# Patient Record
Sex: Male | Born: 1985 | Race: White | Hispanic: No | Marital: Single | Smoking: Never smoker
Health system: Southern US, Community
[De-identification: ages and names within clinical notes are randomized; demographics above are authoritative.]

## PROBLEM LIST (undated history)

## (undated) DIAGNOSIS — K219 Gastro-esophageal reflux disease without esophagitis: Secondary | ICD-10-CM

## (undated) DIAGNOSIS — L8990 Pressure ulcer of unspecified site, unspecified stage: Secondary | ICD-10-CM

## (undated) DIAGNOSIS — J9610 Chronic respiratory failure, unspecified whether with hypoxia or hypercapnia: Secondary | ICD-10-CM

## (undated) DIAGNOSIS — F419 Anxiety disorder, unspecified: Secondary | ICD-10-CM

## (undated) DIAGNOSIS — F32A Depression, unspecified: Secondary | ICD-10-CM

## (undated) DIAGNOSIS — F329 Major depressive disorder, single episode, unspecified: Secondary | ICD-10-CM

## (undated) DIAGNOSIS — G8251 Quadriplegia, C1-C4 complete: Secondary | ICD-10-CM

## (undated) DIAGNOSIS — F319 Bipolar disorder, unspecified: Secondary | ICD-10-CM

## (undated) HISTORY — PX: COLOSTOMY: SHX63

---
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
## 2012-10-17 ENCOUNTER — Other Ambulatory Visit: Payer: Self-pay | Admitting: *Deleted

## 2012-10-17 MED ORDER — TEMAZEPAM 7.5 MG PO CAPS: ORAL_CAPSULE | ORAL | Status: DC

## 2012-10-18 ENCOUNTER — Non-Acute Institutional Stay (SKILLED_NURSING_FACILITY): Payer: Medicaid Other | Admitting: Internal Medicine

## 2012-10-18 DIAGNOSIS — D649 Anemia, unspecified: Secondary | ICD-10-CM

## 2012-10-18 DIAGNOSIS — G542 Cervical root disorders, not elsewhere classified: Secondary | ICD-10-CM

## 2012-10-18 DIAGNOSIS — M531 Cervicobrachial syndrome: Secondary | ICD-10-CM

## 2012-10-18 DIAGNOSIS — I82409 Acute embolism and thrombosis of unspecified deep veins of unspecified lower extremity: Secondary | ICD-10-CM

## 2012-10-18 DIAGNOSIS — L89309 Pressure ulcer of unspecified buttock, unspecified stage: Secondary | ICD-10-CM

## 2012-10-18 NOTE — Progress Notes (Signed)
Patient ID: Alan Sanford, male   DOB: 02-28-1986, 27 y.o.   MRN: [id number] Chief complaint; admission for this SNF after transfer from SNF[facility].  History; this is a now 26 year old man who is a C4 quadriplegic since 2011. This was apparently a result of an attempted suicide, he has been functioning is a C4 quadriplegic since then. His original injury was in [place], [place]. He has apparently been in several nursing homes since and has requested to come to this facility closer to his girlfriend. Most of the medical information. I have comes from an admission to [facility]. This was from 05/28/2012 through 07/12/2012. At that point point. He was septic. He required intubation and levo fed to support his blood pressure. He was extubated the following day, but required reintubation. He was found to have a Pseudomonas urinary tract infection and pneumonia per he developed persistent problems with mucus plugging and was switched to vancomycin, meropenem and gentleman. He had a tracheostomy and PEG tube put in at that point. His trach and PEG tube have since been removed. He is eating on his own. He has a colostomy and a chronic indwelling Foley cath.  The patient tells me that he is at wound issues almost time of his initial injury. Most recently he has been followed by a [facility] wound care center in [place]. He was diagnosed with osteomyelitis or one of his current roughly 2 months ago. He is completing a course of vancomycin tomorrow. I have none of the wound care records.  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 #3.Colostomy and chronic indwelling catheter. The colostomy is for protection of wounds on his buttock. #4 chronic decubitus ulcers. He apparently has had plastic surgery for flap closure  in past #5 probable depression at one point, I did not explore this with a later date if he remains in the facility. #6 bipolar affective disorder, probably at one point.  Social; patient is moved here to be closer to his girlfriend was apparently a Engineer, civil (consulting). He met at [facility].   Family history none related by the patient.  Medications; vitamin D2 50,000 units Monday, Wednesday, and Friday, Dulcolax 5 mg, one by mouth daily for constipation, Prozac, 60 mg a day, Claritin, 10 mg a day, magnesium oxide 400 mg once per day, oxybutynin,er, 5 mg, one by mouth daily for bladder spasm, calcium carbonate 1250 mg by mouth twice a day, ferrous sulfate 325 one by mouth twice a day, vitamin C, 501 by mouth twice a day, Geodon 20 mg, one by mouth twice a day, last for bipolar disorder, 30 mg 1 by mouth 3 times a day for muscle spasms, Depakote ER 500 mg 1 by mouth 3 times a day for bipolar, Neurontin 800 mg by mouth 3 times a day, Pepcid 20 mg, one by mouth each bedtime, Seroquel 300, one by mouth each bedtime, Restoril 7.5, one capsule by mouth, every hs  Review of systems #1 respiratory no underlying chronic disorder. #2 sensory level below the nipples  Physical exam;  Respiratory clear entry bilaterally. Cardiac heart sounds are normal. No murmurs. Abdomen; colostomy appears to be functioning well, slightly firm in the right lateral abdomen. I wonder if this is from Lovenox. GU Foley catheter in place. Neurologic as described last C4 sensory level. Skin he has a wound  over the right issue with some undermining inferiorly. However, this looks very stable with no healthy granulation. No evidence of infection. Over his coccyx area of the larger wound mild amount of surface slough.   Impression/plan #1 pressure area over the right ischial area. This appears to be stable and the wound VAC. #2 pressure area coccyx as long as he is as he is improving with current santyl, dressing should continue although  truthfully I think it might do better with silver alginate. #3 traumatic  injury, chronic quadriplegia. #4 history of anemia on iron. This will need to be rechecked. #5. Bipolar disorder on a large number of psychoactive medications, which for the perceivable future. I will not touch, unless it is clear that he becomes a chronic resident here. Psychiatric consultation would then be in order.  #6 he does not have an underlying respiratory disorder that I am aware of, will change his nebulizers. 2. PRn #7 I don't know if he has an issue with thromboembolic disease or not. He is on full dose Lovenox suggesting he has had active thromboembolic disease. I will see if we can verify the history.  The patient is already asking me about home. Discharge on weekends. I referred him to the facility. We'll followup on his pressure ulcers every 2 weeks. #8 Osteomyelitis. I have no information on this. Due to complete Vanc tomorrow. Picc in rt upper arm.   I will maintain the picc line for now. The lovenox use will need to be verified.

## 2012-10-20 ENCOUNTER — Other Ambulatory Visit (HOSPITAL_BASED_OUTPATIENT_CLINIC_OR_DEPARTMENT_OTHER): Payer: Self-pay | Admitting: Internal Medicine

## 2012-10-20 DIAGNOSIS — B999 Unspecified infectious disease: Secondary | ICD-10-CM

## 2012-10-23 ENCOUNTER — Ambulatory Visit (HOSPITAL_COMMUNITY)
Admission: RE | Admit: 2012-10-23 | Discharge: 2012-10-23 | Disposition: A | Discharge status: Home / Self Care | Payer: Federal, State, Local not specified - PPO | Source: Ambulatory Visit | Attending: Internal Medicine | Admitting: Internal Medicine

## 2012-10-23 ENCOUNTER — Other Ambulatory Visit (HOSPITAL_BASED_OUTPATIENT_CLINIC_OR_DEPARTMENT_OTHER): Payer: Self-pay | Admitting: Internal Medicine

## 2012-10-23 DIAGNOSIS — Z452 Encounter for adjustment and management of vascular access device: Secondary | ICD-10-CM | POA: Insufficient documentation

## 2012-10-23 DIAGNOSIS — B999 Unspecified infectious disease: Secondary | ICD-10-CM

## 2012-10-23 MED ORDER — CHLORHEXIDINE GLUCONATE 4 % EX LIQD
CUTANEOUS | Status: AC
  Filled 2012-10-23: qty 30

## 2012-10-25 ENCOUNTER — Non-Acute Institutional Stay (SKILLED_NURSING_FACILITY): Payer: Federal, State, Local not specified - PPO | Admitting: Internal Medicine

## 2012-10-25 DIAGNOSIS — D649 Anemia, unspecified: Secondary | ICD-10-CM | POA: Insufficient documentation

## 2012-10-25 DIAGNOSIS — I2699 Other pulmonary embolism without acute cor pulmonale: Secondary | ICD-10-CM

## 2012-10-25 DIAGNOSIS — G8250 Quadriplegia, unspecified: Secondary | ICD-10-CM

## 2012-10-25 DIAGNOSIS — G4700 Insomnia, unspecified: Secondary | ICD-10-CM

## 2012-10-25 NOTE — Progress Notes (Signed)
Patient ID: Alan Sanford, male   DOB: 1985/08/21, 27 y.o.   MRN: [id number]  Chief complaint-acute visit secondary to anticoagulation issues with history of pulmonary embolism-insomnia.  History of present illness.  Patient is a pleasant 27 year old male with a history of C4 spinal injury and quadriplegia secondary to trauma.  He also has a history of a pulmonary embolism apparently back in October of last year.  He is currently on Lovenox for anticoagulation-we would like to switch this to an oral agent if possible.  Patient also is complaining of insomnia he is on Restoril 7.5 mg and would like that increased.  He does not report any respiratory depression or grogginess in the morning  In regards to anticoagulation-I did have an extensive discussion with his fiance who also was a Engineer, civil (consulting) at [facility] where he was hospitalized-apparently he had been on Xeralto-at some point but developed acute mental status changes and confusion-.  Apparently also had been on Pradaxa   at one point and tolerated this well but was taken of  that back during his most recent hospitalization and put on Lovenox.  Family medical social history has been reviewed per history and physical on 10/18/2012.  Medications have been reviewed.  Review of systems.  Gen. he is denying any fever or chills.  Respiratory does not complain of any shortness of breath.  Cardiac-denies any chest pain.  Muscle skeletal-does not complaining of joint pain at this time.  Neurologic-spinal cord injury as noted in history of present illness.  He is complaining of some insomnia.  Psych-appears to be in good spirits and this has been the case since his arrival here.  Physical exam.  Temperature is 97.9 pulse 70 respirations 18 blood pressure 100/76-116/66  -in general this is a pleasant young male in no distress lying in bed.  His skin is warm and dry.--Do not really note any increased bruising or bleeding  Chest is clear  to auscultation without rhonchi rales or wheezes.  Heart is regular rate and rhythm without murmur gallop or rub.  Abdomen is soft nontender with active bowel sounds.--Colostomy site appears unremarkable  Neurologic-does have quadriplegia x4 with reduced sensation secondary to C4 injury.  Psych-he is alert and oriented x3 pleasant and appropriate  Labs-none noted in the chart for review-updated labs have been ordered. Including a CBC a metabolic panel--  Assessment and plan.  #1-anticoagulation management with history of pulmonary embolism in October 2013-as stated above he apparently did not tolerate Xeralto--- and did well with Pradax which was discontinued during his most recent hospitalization.  -At this point will order a d-dimer for Dr. Jannetta Quint review next week-continue Lovenox for now-- This was discussed with patient's responsible party-- his fiance-- as well.  #2-insomnia-we'll increase Restoril to 15 mg each bedtime-apparently there has been no oversedation or respiratory depression noted this will have to be monitored.  [id number] note greater than 30 minutes spent assessing patient--an extensive discussion with his responsible party via phone -and formulating a plan of care    .

## 2012-10-26 ENCOUNTER — Other Ambulatory Visit: Payer: Self-pay | Admitting: Geriatric Medicine

## 2012-10-26 MED ORDER — TEMAZEPAM 15 MG PO CAPS: ORAL_CAPSULE | ORAL | Status: DC

## 2012-10-26 MED ORDER — TEMAZEPAM 7.5 MG PO CAPS: ORAL_CAPSULE | ORAL | Status: DC

## 2012-11-01 ENCOUNTER — Non-Acute Institutional Stay (SKILLED_NURSING_FACILITY): Payer: Medicaid Other | Admitting: Internal Medicine

## 2012-11-01 DIAGNOSIS — L89309 Pressure ulcer of unspecified buttock, unspecified stage: Secondary | ICD-10-CM

## 2012-11-01 DIAGNOSIS — I82409 Acute embolism and thrombosis of unspecified deep veins of unspecified lower extremity: Secondary | ICD-10-CM

## 2012-11-01 DIAGNOSIS — I2699 Other pulmonary embolism without acute cor pulmonale: Secondary | ICD-10-CM

## 2012-11-01 NOTE — Progress Notes (Signed)
Patient ID: Alan Sanford, male   DOB: 18-Apr-1986, 27 y.o.   MRN: [id number] Chief complaint ; review of anticoagulation, decubitus ulcer.  History; this gentleman has a traumatic quadriplegic since 2011. During the hospitalization in Oct 21, 2013He had a pulmonary embolus. I have none of this information. He came to was on full dose Lovenox. He has a d-dimer within the normal range at 0.27 mcg per ml. Apparently, befor his index hospitalization in December to January 2014, he was on pradaxa, this was apparently changed to Lovenox during this hospitalization. The hospitalization itself was complicated by sepsis, acute respiratory failure requiring intubation. It is not surprising therefore, that he ended up on Lovenox for continued treatment of the pulmonary embolus. He apparently was also on xarelto at one point, but developed acute mental status change. I am not clear about why he was never on Coumadin. Nor I are really clear about the expected duration of treatment.  I reviewed this with his girlfriend and his power of attorney. The original pulmonary embolism presented as a pulmonary mass, [pulmonary infarct]. He really did have mental status changes, which were attributed to the xarelto. Pradaxa was chosen, so that he would not have to have PT and INR checks. If he ever went home. He has no prior history of clotting disorder, no family history of same.  Review of systems; Respiratory no shortness of breath, no cough. Cardiac no chest pain. Skin we continue with the wound VAC to the right initial tuberosity wound. The sacral/wound may need silver alginate, were reviewed next week. Extremities no swelling. Neurologic girlfriend is noted tremors.   Physical exam; Respiratory clear bilaterally. Cardiac heart sounds are normal. No S3 no S4. Skin; his over the right initial tuberosity was very healthy still a 3 mm of depth. Superficial wound over his coccyx area.  Extremities no  edema.  Impression/plan #1 pulmonary embolism/infarct. This was in 10/21/13almost resulted in death. They were apparently told that he would need indefinite anticoagulation. I have discussed this with his POA who is a Engineer, civil (consulting) at [facility]. I've agreed to put him back on pradaxa, even though this is an off label use. I will try to discuss this with his pulmonologist here for in [place]. 2 osteomyelitis; he was followed at a wound care clinic. His vancomycin is completed. White count 3.8, with normal differential, sedimentation rate 11. I will maintain the PICC line for now. #3 bipolar disorder; apparently this has been very severe in the past. I asked his POA about the indications for 2 different antipsychotics,, valproic acid. She informed me that his problems were just severe. I will have the psychiatrist reviewed and in the facility. Right now. I have no plans to change his medication. Fortunately, his mood seems very stable

## 2012-11-02 ENCOUNTER — Other Ambulatory Visit: Payer: Self-pay | Admitting: Geriatric Medicine

## 2012-11-02 MED ORDER — OXYCODONE HCL 10 MG PO TABS: ORAL_TABLET | ORAL | Status: DC

## 2012-11-09 ENCOUNTER — Other Ambulatory Visit: Payer: Self-pay | Admitting: Geriatric Medicine

## 2012-11-09 MED ORDER — DIAZEPAM 10 MG PO TABS: 10.0000 mg | ORAL_TABLET | Freq: Three times a day (TID) | ORAL | Status: DC | PRN

## 2012-11-10 ENCOUNTER — Other Ambulatory Visit: Payer: Self-pay | Admitting: *Deleted

## 2012-11-10 ENCOUNTER — Other Ambulatory Visit (HOSPITAL_BASED_OUTPATIENT_CLINIC_OR_DEPARTMENT_OTHER): Payer: Self-pay | Admitting: Internal Medicine

## 2012-11-10 DIAGNOSIS — S31000A Unspecified open wound of lower back and pelvis without penetration into retroperitoneum, initial encounter: Secondary | ICD-10-CM

## 2012-11-10 MED ORDER — DIAZEPAM 10 MG PO TABS: 10.0000 mg | ORAL_TABLET | Freq: Three times a day (TID) | ORAL | Status: DC | PRN

## 2012-11-13 ENCOUNTER — Ambulatory Visit (HOSPITAL_COMMUNITY)
Admission: RE | Admit: 2012-11-13 | Discharge: 2012-11-13 | Disposition: A | Discharge status: Home / Self Care | Payer: Self-pay | Source: Ambulatory Visit | Attending: Internal Medicine | Admitting: Internal Medicine

## 2012-11-29 ENCOUNTER — Non-Acute Institutional Stay (SKILLED_NURSING_FACILITY): Payer: Federal, State, Local not specified - PPO | Admitting: Internal Medicine

## 2012-11-29 DIAGNOSIS — D649 Anemia, unspecified: Secondary | ICD-10-CM

## 2012-11-29 DIAGNOSIS — F319 Bipolar disorder, unspecified: Secondary | ICD-10-CM | POA: Insufficient documentation

## 2012-11-29 DIAGNOSIS — R609 Edema, unspecified: Secondary | ICD-10-CM | POA: Insufficient documentation

## 2012-11-29 DIAGNOSIS — G8250 Quadriplegia, unspecified: Secondary | ICD-10-CM

## 2012-11-29 DIAGNOSIS — R4182 Altered mental status, unspecified: Secondary | ICD-10-CM

## 2012-11-29 NOTE — Progress Notes (Signed)
Patient ID: Alan Sanford, male   DOB: 1986-06-27, 27 y.o.   MRN: [id number]  This is an acute visit.  Facility [facility].  Level of care skilled.  Chief complaint-acute visit secondary to patient's concerns about Depakote-followup anemia-.  History of present illness.  Patient is a pleasant 27 year old male with a history of C4 quadriplegia since 2011.  Apparently this was the result of an attempted suicide.  He has been a functioning C4 quadriplegic since that.   he does have a listed history of depression bipolar disorder and is on numerous medications including Geodon as well as Depakote 500 mg 3 times a day.  He feels the Depakote occasionally is leading to mental status changes says he talks kind of funny at times.  He states he did not receive the Depakote apparently recently during that  leave of absence and his speech was consistently clearer as well as his thought patterns  He would like to Depakote reduced or discontinued.  In regards to depression and bipolar disorder this has been very stable during his stay here.  Otherwise he denies any shortness of breath chest pain appears to be doing well.  Family medical social history has been reviewed her history and physical on 10/31/2012.  Medications have been reviewed per MAR.  Review of systems.  In general denies fever or chills.  Respiratory denies any shortness of breath or cough.  Cardiac-does not complaining of chest pain.  GI he does have a colostomy bag this has been functioning well denies any abdominal pain nausea or vomiting diarrhea or constipation.  Muscle skeletal does have history of quadriplegia this is stable he does not complaining of pain tonight.  Neurologic-as stated above does not complaining of numbness does continue on Neurontin.  Psych-as stated above he appears to be doing well and is stable.  Physical exam.  Temperature is 90.4 pulse 70 respirations 18 blood pressure 118/74 weight  is stable at 177.  In general this is a pleasant young male in no distress lying in bed he appears comfortable.  The skin is warm and dry apparently does have a history ofischialwound this is followed by Wound Care and Dr. Leanord Hawking  To oropharynx clear mucous membranes moist.  Eyes pupils appear reactive to light extraocular movements intact visual acuity appears grossly intact.  Chest is clear to auscultation without rhonchi rales or wheezes.  Heart is regular rate and rhythm without murmur gallop or rub.--He does have some mild edema bilaterally this appears greater on the left versus the right--pedal pulses somewhat reduced  Abdomen is soft nontender with active bowel sounds colostomy bag appears unremarkable with a moderate amount of stool in it.  GU he does have a Foley catheter draining amber colored urine.  Neurologic again does have deficits secondary to C4 quadriplegia.  His speech is clear-he is alert and oriented x3 pleasant and appropriate.  Psych-as stated above is pleasant and appropriate alert and oriented x3-labs.  11/13/2012.  WBC 9.7 hemoglobin 11.8 platelets 318.  11/07/2012.  WBC 3.8 hemoglobin 11.5 platelets 250.  BUN 10 creatinine 0.36 sodium 140 potassium 4.1.  10/25/2012.  Hemoglobin was 15.3.  Liver function tests were within normal limits.  Valproic acid level was 88.  Assessment and plan.  #1-edema-a bit unilateral on the left-will order a Doppler ultrasound to rule out a DVT--he is on anticoagulation-Pradaxa--with history of pulmonary embolus  #2   altered mental status-history of depression-bipolar disorder-he does continue on Depakote-will order a Depakote level secondary to  his concerns-also liver function tests and ammonia level.  Neurologically he does appear to be at baseline-secondary to stability of psychiatric issues would consider decreasing Depakote level but will discuss with Dr. Boykin Reaper possibility of psych consult  As  well  3-anemia-hemoglobin did drop some according to early May level but has been stable since-orders have been written to guaiac stools x3 so far it appears I see one documented that was negative-will write order to make sure additional samples are obtained-also will update CBC-this appears to be a normocytic anemia-he is on iron--of note he does continue on anticoagulation with history of embolism on Pradaxa  [id number]-of note greater than 30 minutes spent assessing patient-discussing his concerns-and formulating plan of care for numerous diagnoses

## 2012-12-04 ENCOUNTER — Non-Acute Institutional Stay (SKILLED_NURSING_FACILITY): Payer: Federal, State, Local not specified - PPO | Admitting: Internal Medicine

## 2012-12-04 DIAGNOSIS — L89309 Pressure ulcer of unspecified buttock, unspecified stage: Secondary | ICD-10-CM

## 2012-12-08 ENCOUNTER — Other Ambulatory Visit: Payer: Self-pay | Admitting: Geriatric Medicine

## 2012-12-08 MED ORDER — ALPRAZOLAM 0.5 MG PO TABS: ORAL_TABLET | ORAL | Status: DC

## 2012-12-13 NOTE — Progress Notes (Signed)
Patient ID: Alan Sanford, male   DOB: 10/04/1985, 27 y.o.   MRN: [id number]           PROGRESS NOTE  DATE:  12/04/2012    FACILITY: [facility]   LEVEL OF CARE:   SNF   Acute Visit   CHIEF COMPLAINT:  Wound review.  HISTORY OF PRESENT ILLNESS:  Alan Sanford is a gentleman who is a C4 quadriplegic since 2011.  He came to [place] with a wound VAC over a fairly large wound over his right ischial tuberosity.  He had previously been treated for chronic osteomyelitis, completing a course of vancomycin shortly after he arrived here.  He also has a wound over his coccyx.  Per the Wound Care nurse, both of these have been doing very well with a wound VAC on the right ischial tuberosity wound and silver alginate over the coccyx.    Unfortunately, the patient was on his bottom for six hours today, engaged in eating.  He was not able to use his Roho cushion and was not able to adjust his weight.    PHYSICAL EXAMINATION:   SKIN:  INSPECTION:  The area over his right ischium, which  apparently was a vibrant pink color, is now a dusky maroon red.  This was just too much pressure put on this area.   The area over his coccyx has significant hypergranulation and a necrotic rim at roughly 4 o'clock.    ASSESSMENT/PLAN:  Stage IV wound, right ischial tuberosity.  The worry here is that he has put excess pressure on a developing granulation.  I am not going to change the wound VAC at the moment, although I have emphasized the need for pressure off-loading for perhaps the next 48-72 hours.  Wound over the coccyx.  I debrided this with a #10 scalpel.  He is on Pradaxa for DVT prophylaxis.  Hemostasis with direct pressure, although this was difficult.    Careful monitoring of these wounds is going to be necessary, particularly concerning his right ischial tuberosity wound.  It is quite possible that the developing granulation here will slough off.    CPT CODE: 84696

## 2012-12-18 ENCOUNTER — Other Ambulatory Visit: Payer: Self-pay | Admitting: *Deleted

## 2012-12-18 MED ORDER — TEMAZEPAM 7.5 MG PO CAPS: 7.5000 mg | ORAL_CAPSULE | Freq: Every evening | ORAL | Status: DC | PRN

## 2012-12-22 ENCOUNTER — Other Ambulatory Visit: Payer: Self-pay | Admitting: *Deleted

## 2012-12-22 MED ORDER — OXYCODONE HCL 10 MG PO TABS: ORAL_TABLET | ORAL | Status: DC

## 2012-12-26 ENCOUNTER — Non-Acute Institutional Stay (SKILLED_NURSING_FACILITY): Payer: Federal, State, Local not specified - PPO | Admitting: Internal Medicine

## 2012-12-26 DIAGNOSIS — I2699 Other pulmonary embolism without acute cor pulmonale: Secondary | ICD-10-CM

## 2012-12-26 DIAGNOSIS — F319 Bipolar disorder, unspecified: Secondary | ICD-10-CM

## 2012-12-26 DIAGNOSIS — R7989 Other specified abnormal findings of blood chemistry: Secondary | ICD-10-CM

## 2012-12-26 DIAGNOSIS — R509 Fever, unspecified: Secondary | ICD-10-CM

## 2012-12-26 DIAGNOSIS — D649 Anemia, unspecified: Secondary | ICD-10-CM

## 2012-12-26 NOTE — Progress Notes (Signed)
Patient ID: Alan Sanford, male   DOB: [birth year], 27 y.o.   MRN: [id number] This is an acute visit.   Facility [facility].   Level of care skilled.   Chief complaint-acute visit secondary to fever of unknown origin--.   History of present illness.   Patient is a pleasant 27 year old male with a history of C4 quadriplegia since 2011.   Apparently this was the result of an attempted suicide.   He has been a functioning C4 quadriplegic since that. About a week ago he spiked a temperature slightly over 100-lab work was ordered including blood cultures as well as a urine culture and chest x-ray.  The chest x-ray did not show any acute process the urine culture did grow out Klebsiella oxytoca greater than 100,000 colonies he empirically had been started on Cipro this was switched to Rocephin which he continues on.  Blood cultures apparently came back yesterday and showed coagulase-negative staph aureus as well as strep viridans- which is is sensitive to the Rocephin.  It appears he's been afebrile since last week he says he does not feel like he is having fever chills.  He does have a history of osteomyelitis with a history o  fcoccyx  and right tibial wound status this is followed by wound care and apparently these are improving.  He does have a PICC line placed secondary to this asshe was receiving vancomycin   -also  haso an indwelling Foley catheter which is draining amber colored urine.  In regards to other issues these appear to be stable.  He does have a history of significant depression and associate  bipolar disorder.  He is on numerous agents including Prozac Depakote Valium and Seroquel as well as Wellbutrin.  Apparently at one point it was felt the Depakote was leading to mental status changes the Depakote was reduced this has been titrated up somewhat secondary to apparently increased behaviors and this appears to have stabilized.  Patient also has a history of  anemia hemoglobin back in April was over 15 however subsequent  level done in May showed a drop into the 11-12 range and has hovered in that range We did order guaiac testing apparently these have been negative x3.  Today he denies any fever chills appears to be at his baseline.      Family medical social history has been reviewed per history and physical on 10/31/2012.   Medications have been reviewed per MAR.   Review of systems.   In general denies fever or chills.   Respiratory denies any shortness of breath or cough.   Cardiac-does not complaining of chest pain.   GI he does have a colostomy bag this has been functioning well denies any abdominal pain nausea or vomiting diarrhea or constipation.   Muscle skeletal does have history of quadriplegia this is stable he does not complaining of pain .   Neurologic-as stated above does not complain of numbness does continue on Neurontin.   Psych-as stated above he appears to be doing well and is stable.   Physical exam.  Temperature is 98.4 pulse 68 respirations 18 blood pressure 122/64  these are baseline his weight is stable at 177    In general this is a pleasant young male in no distress lying in bed he appears comfortable.   The skin is warm and dry ----y does have a history ofischialwound this is followed by Wound Care and Dr. Theador Hawthorne I did speak with wound care today she says these  wounds are looking significantly improved no sign of infection drainage or odor currently have dressing applied the wound VAC has been discontinued   To oropharynx clear mucous membranes moist.   Eyes pupils appear reactive to light extraocular movements intact visual acuity appears grossly intact.   Chest is clear to auscultation without rhonchi rales or wheezes.   Heart is regular rate and rhythm without murmur gallop or rub.--He does have some mild edema bilaterally--this is relatively baseline with previous  exams d   Abdomen is soft nontender with active bowel sounds colostomy bag appears unremarkable with a moderate amount of stool in it.   GU he does have a Foley catheter draining amber colored urine.   Neurologic again does have deficits secondary to C4 quadriplegia.   His speech is clear-he is alert and oriented x3 pleasant and appropriate.   Psych-as stated above is pleasant and appropriate alert and oriented x3-labs  r  Labs.  12/17/2012.  Urine culture as noted above.  12/18/2012.  Chest x-ray did not show any acute process.  12/18/2012.  Sodium 134 potassium 3.9 BUN 6 creatinine 0.32.  Liver function tests within normal limits except albumin of 2.7 ALT of 73 AST of 82.  CBC 9.6 hemoglobin 11.6 platelets 177.  Marland Kitchen   11/13/2012.   WBC 9.7 hemoglobin 11.8 platelets 318.   11/07/2012.   WBC 3.8 hemoglobin 11.5 platelets 250.   BUN 10 creatinine 0.36 sodium 140 potassium 4.1.   10/25/2012.   Hemoglobin was 15.3.   Liver function tests were within normal limits.   Valproic acid level was 88.   Assessment and plan.  #1-fever of unknown origin-with positive blood culture for strep viridans-he is on Rocephin which is effective against the bacteria in the urine-also against a strep viridans-there is suspicion the positive blood cultures secondary to the PICC line--there have been orders to remove this.  Also will extend the Rocephin for a 14 day total course-this point he appears stable but will have to be monitored closely--also will obtain a CBC with differential --t white count last week was within normal limits.  An echocardiogram also has been ordered.  #2-anemia -- his hemoglobin has stabilized his appears to be a normocytic ---he is on iron-we'll order iron studies as well as a B12 and folate--Will also update CBC.  #3  mildly elevated liver function tests-he is on numerous agents including Depakote-we'll update this to make sure it is not  progressing.  #4 history depression with bipolar disorder-this appears stable on current medications appears to have benefited from the Depakote increase after initial decrease as his mental status and behaviore apparently returned to baseline he was pleasant and appropriate during exam--we'll update Depakote level  #5-history of embolism-he continues on her Pradaxa has tolerated this well  [id number]-of note45 minutes spent assessing patient-discussed his status with nursing staff-reviewing extensive records-and formulating a plan of care--greater than 50% of time spent coordinating and formulating a plan of care

## 2013-01-24 ENCOUNTER — Non-Acute Institutional Stay (SKILLED_NURSING_FACILITY): Payer: Federal, State, Local not specified - PPO | Admitting: Internal Medicine

## 2013-01-24 DIAGNOSIS — I2699 Other pulmonary embolism without acute cor pulmonale: Secondary | ICD-10-CM

## 2013-01-24 DIAGNOSIS — R509 Fever, unspecified: Secondary | ICD-10-CM

## 2013-01-24 DIAGNOSIS — R7989 Other specified abnormal findings of blood chemistry: Secondary | ICD-10-CM

## 2013-01-24 DIAGNOSIS — N390 Urinary tract infection, site not specified: Secondary | ICD-10-CM

## 2013-01-24 DIAGNOSIS — F319 Bipolar disorder, unspecified: Secondary | ICD-10-CM

## 2013-01-24 DIAGNOSIS — D649 Anemia, unspecified: Secondary | ICD-10-CM

## 2013-01-24 DIAGNOSIS — G8250 Quadriplegia, unspecified: Secondary | ICD-10-CM

## 2013-01-24 NOTE — Progress Notes (Signed)
Patient ID: Alan Sanford, male   DOB: 1985-12-06, 27 y.o.   MRN: [id number] This is an acute/routine   visit.  Facility [facility].  Level of care skilled .  Chief complaint-. Medical management of quadriplegia-anemia-neuropathy-history of depression with bipolar disorder-acute visit followup UTI   History of present illness.  Patient is a pleasant 27 year old male with a history of C4 quadriplegia since 2011.  Apparently this was the result of an attempted suicide.  He has been a functioning C4 quadriplegic since that.  About a month ago he spiked a temperature slightly over 100-lab work was ordered including blood cultures as well as a urine culture and chest x-ray.  The chest x-ray did not show any acute process the urine culture did grow out Klebsiella oxytoca greater than 100,000 colonies he empirically had been started on Cipro this was switched to Rocephin which he has completed.  Blood cultures apparently came back  and showed coagulase-negative staph aureus as well as strep viridans- which was is sensitive to the Rocephin.  It appears he's been afebrile--his PICC line was removed and there has been no reoccurrence to my knowledge-he appears to be stable in this regard  .  He does have a history of osteomyelitis with a history o fcoccyx and right tibial wound status this is followed by wound care and apparently these are improving.  -also haso an indwelling Foley catheter which is draining amber colored urine-however recently the staff noted some increased sediment patient was complaining asking for a urine culture which did come back showing Pseudomonas-he has been started onCefepime on day 2 of 10-he does have a listed allergy to this however sensitivities are limited to the strain of Pseudomonas-and he has tolerated Cefepime well in the past with monitoring for any rashes.  In regards to other issues these appear to be stable.  He does have a history of significant depression and  associate bipolar disorder.  He is on numerous agents including Prozac Depakote Valium and Seroquel as well as Wellbutrin.  Apparently at one point it was felt the Depakote was leading to mental status changes the Depakote was reduced this has been titrated up somewhat secondary to apparently increased behaviors and this appears to have stabilized.  Patient also has a history of anemia hemoglobin back in April was over 15 however subsequent level done in May showed a drop into the 11-12 range earlier this month it was back up to 13.8-he is on iron  We did order guaiac testing apparently these have been negative x3.  Today he denies any fever chills appears to be at his baseline.   Family medical social history has been reviewed per history and physical on 10/31/2012.   Medications have been reviewed per MAR .  Review of systems.  In general denies fever or chills.  Respiratory no complaints of shortness of breath or cough.  Cardiac-does not complaining of chest pain.  GI he does have a colostomy bag this has been functioning well denies any abdominal pain nausea or vomiting diarrhea or constipation GU-as stated in history of present illness.  Muscle skeletal does have history of quadriplegia this is stable he does not complain of pain .  Neurologic-as stated above does not complain of numbness does continue on Neurontin.  Psych-as stated above he appears to be doing well and is stable .  Physical exam.  Temperature 97.9 pulse 60 respirations 16 blood pressure 124/78-weight is stable at 177  In general this is a pleasant  young male in no distress lying in bed he appears comfortable.  The skin is warm and dry ---- does have a history ofischialwound this is followed by Wound Care and Dr. Leanord Hawking-  oropharynx clear mucous membranes moist.  Eyes pupils appear reactive to light extraocular movements intact visual acuity appears grossly intact.  Chest is clear to auscultation without rhonchi rales  or wheezes.  Heart is regular rate and rhythm without murmur gallop or rub.--He does have some mild edema bilaterally--this is relatively baseline with previous exams  d  Abdomen is soft nontender with active bowel sounds colostomy bag appears unremarkable with a moderate amount of stool in it.  GU he does have a Foley catheter draining amber colored urine.  Neurologic again does have deficits secondary to C4 quadriplegia.  His speech is clear-he is alert and oriented x3 pleasant and appropriate.  Psych-as stated above is pleasant and appropriate alert and oriented x3-  r  Labs 12/27/2012.  WBC 9.5 hemoglobin 13.8 platelets 270.  Sodium 145 potassium 3.8 BUN 9 creatinine 0.36.  Liver function tests within normal limits.  Valproic acid level XLIX.  [id number].  Folate-15.6- .  12/17/2012.  Urine culture as noted above.  12/18/2012.  Chest x-ray did not show any acute process.  12/18/2012.  Sodium 134 potassium 3.9 BUN 6 creatinine 0.32.  Liver function tests within normal limits except albumin of 2.7 ALT of 73 AST of 82.  CBC 9.6 hemoglobin 11.6 platelets 177.  Marland Kitchen  11/13/2012.  WBC 9.7 hemoglobin 11.8 platelets 318.  11/07/2012.  WBC 3.8 hemoglobin 11.5 platelets 250.  BUN 10 creatinine 0.36 sodium 140 potassium 4.1.  10/25/2012.  Hemoglobin was 15.3.  Liver function tests were within normal limits.  Valproic acid level was 88.   Assessment and plan. #1-UTI-this appears to be stable he is on Cefepime appears to be tolerating this well continue to monitor  #2-fever of unknown origin-with positive blood culture for strep viridans-there's been no reoccurrence he is status post antibiotic-PICC line has been removed suspicions a PICC line was responsible for this #3-history of quadriplegia-patient appears to be doing well and stable in this environment-he is on numerous agents including baclofen ibuprofen as well as Neurontin and oxybutynin .  4-anemia -- his hemoglobin has  stabilized c ---he is on iron-Will also update CBC.  #82mildly elevated liver function tests-this has normalized per recent lab #6 history depression with bipolar disorder-this appears stable on current medications appears to have benefited from the Depakote increase after initial decrease as his mental status and behaviore apparently returned to baseline he was pleasant and appropriate during exam--  #7-history of embolism-he continues on her Pradaxa has tolerated this well #8-neuropathy-he continues on Neurontin apparently with good effect.  #9 as high-risk meds-will check a hemoglobin A1c also a basic metabolic panel  [phone]

## 2013-01-25 DIAGNOSIS — N390 Urinary tract infection, site not specified: Secondary | ICD-10-CM | POA: Insufficient documentation

## 2013-02-11 ENCOUNTER — Non-Acute Institutional Stay (SKILLED_NURSING_FACILITY): Payer: Federal, State, Local not specified - PPO | Admitting: Internal Medicine

## 2013-02-11 DIAGNOSIS — F329 Major depressive disorder, single episode, unspecified: Secondary | ICD-10-CM

## 2013-02-11 DIAGNOSIS — R635 Abnormal weight gain: Secondary | ICD-10-CM

## 2013-02-11 DIAGNOSIS — R609 Edema, unspecified: Secondary | ICD-10-CM

## 2013-02-11 DIAGNOSIS — N390 Urinary tract infection, site not specified: Secondary | ICD-10-CM

## 2013-02-11 NOTE — Progress Notes (Signed)
Patient ID: Alan Sanford, male   DOB: 1986-04-16, 27 y.o.   MRN: [id number] This is an acute visit.  Facility [facility].  Level of care skilled  .  Chief complaint-. Acute visit secondary to edema weight gain   History of present illness.  Patient is a pleasant 27 year old male with a history of C4 quadriplegia since 2011.  Apparently this was the result of an attempted suicide.  He has been a functioning C4 quadriplegic since that He recently has had some weight gain-his fiance who is his responsible party and also happens to be a nurse states that at one point he was on Lasix 40 mg a day-she feels he may need this again.  It appears he's gained about 13 pounds in the last 3 weeks.  He does not complaining of any shortness of breath or chest pain he does not have a listed history of CHF Earlier this month he did get a couple days of Lasix 20 mg a day.  He also has just completed antibiotic for UTI Pseudomonas he was on cefepime   he does not complaining of any fever or chills he does have a history of frequent UTIs.     Marland Kitchen  He does have a history of osteomyelitis with a history o fcoccyx and right tibial wound status this is followed by wound care and apparently these are improving.  Marland Kitchen  He does have a history of significant depression and associate bipolar disorder.  He is on numerous agents including Prozac Depakote Valium and Seroquel he was on Wellbutrin but psychiatrist has dc'd this.  Apparently at one point it was felt the Depakote was leading to mental status changes the Depakote was reduced this has been titrated up somewhat secondary to apparently increased behaviors and this appears to have stabilized.  Patient also has a history of anemia hemoglobin back in April was over 15 however subsequent level done in May showed a drop into the 11-12 range earlier this month it was back up to 13.8-he is on iron  We did order guaiac testing apparently these have been negative x3.  Today  he denies any fever chills appears to be at his baseline.   Family medical social history has been reviewed per history and physical on 10/31/2012 .  Medications have been reviewed per MAR  .  Review of systems.  In general denies fever or chills.  Respiratory no complaints of shortness of breath or cough.  Cardiac-does not complaining of chest pain--he has some edema appears to be relatively mild but somewhat increased from baseline.  GI he does have a colostomy bag this has been functioning well denies any abdominal pain nausea or vomiting diarrhea or constipation  GU-no complaints of dysuria does have a history of UTI.  Muscle skeletal does have history of quadriplegia this is stable he does not complain of pain .  Neurologic-as stated above does not complain of numbness does continue on Neurontin.  Psych-as stated above he appears to be doing well and is stable  .  Physical exam Temperature 96.7 pulse 78 respirations 16 blood pressure 112/62-weight 190 this appears to be a gain of about 13 pounds in the past 3 weeks --O2 95% on room air  In general this is a pleasant young male in no distress lying in bed he appears comfortable.  The skin is warm and dry ---- does have a history of ischial wound this is followed by Wound Care and Dr. Leanord Hawking-  oropharynx  clear mucous membranes moist.  Eyes pupils appear reactive to light extraocular movements intact visual acuity appears grossly intact.  Chest is clear to auscultation without rhonchi rales or wheezes.  Heart is regular rate and rhythm without murmur gallop or rub.--He does have some  edema bilaterally-- moderately increased compared to previous exams  d  Abdomen is soft nontender with active bowel sounds colostomy bag appears unremarkable with a moderate amount of stool in it.  GU he does have a Foley catheter draining amber colored urine.  Neurologic again does have deficits secondary to C4 quadriplegia.  His speech is clear-he is  alert and oriented x3 pleasant and appropriate.  Psych-as stated above is pleasant and appropriate alert and oriented x3-  r  Labs 02/05/2013.  W BC 5.9 hemoglobin 13.6 weight is 172.    12/27/2012.  WBC 9.5 hemoglobin 13.8 platelets 270.  Sodium 145 potassium 3.8 BUN 9 creatinine 0.36.  Liver function tests within normal limits.  Valproic acid level XLIX.  [id number].  Folate-15.6-  .  12/17/2012.  Urine culture as noted above.  12/18/2012.  Chest x-ray did not show any acute process.  12/18/2012.  Sodium 134 potassium 3.9 BUN 6 creatinine 0.32.  Liver function tests within normal limits except albumin of 2.7 ALT of 73 AST of 82.  CBC 9.6 hemoglobin 11.6 platelets 177.  Marland Kitchen  11/13/2012.  WBC 9.7 hemoglobin 11.8 platelets 318.  11/07/2012.  WBC 3.8 hemoglobin 11.5 platelets 250.  BUN 10 creatinine 0.36 sodium 140 potassium 4.1.  10/25/2012.  Hemoglobin was 15.3.  Liver function tests were within normal limits.  Valproic acid level was 88 .  Assessment and plan.   #1- Edema-apparently this is not new-clinically he appears stable as tolerated apparently Lasix in the past will give 40 mg Lasix and 20 mEq of potassium as well we will give the potassium twice a day since he's been on this in the past at this dose.--And potassium level continue be stable per his RP-  Who -I spoke with on the phone  Will check a basic metabolic panel as well as a hemoglobin A1c.  Also weigh 2 times a week and notify provider of any gain greater than 3 pounds   #2-UTI-at this point appears stable he has been afebrile denies any fever or chills although he is quite vulnerable in this regard   #3-history of quadriplegia-patient appears to be doing well and stable in this environment-he is on numerous agents including baclofen ibuprofen as well as Neurontin and oxybutynin  .  #4-history of depression-he is followed by psychiatric service his Wellbutrin was just discontinued we'll continue to  monitor   [phone] --of note 25 minutes spent assessing patient-discussing his status with nursing staff as well as with his responsible party via phone-and formulating a plan of care

## 2013-02-14 ENCOUNTER — Encounter (HOSPITAL_COMMUNITY): Payer: Self-pay | Admitting: *Deleted

## 2013-02-14 ENCOUNTER — Emergency Department (HOSPITAL_COMMUNITY): Payer: Federal, State, Local not specified - PPO

## 2013-02-14 ENCOUNTER — Inpatient Hospital Stay (HOSPITAL_COMMUNITY)
Admission: EM | Admit: 2013-02-14 | Discharge: 2013-02-15 | DRG: 569 | Disposition: A | Discharge status: Skilled Nursing Facility | Payer: Federal, State, Local not specified - PPO | Attending: Internal Medicine | Admitting: Internal Medicine

## 2013-02-14 ENCOUNTER — Other Ambulatory Visit: Payer: Self-pay | Admitting: Geriatric Medicine

## 2013-02-14 DIAGNOSIS — I2699 Other pulmonary embolism without acute cor pulmonale: Secondary | ICD-10-CM | POA: Diagnosis present

## 2013-02-14 DIAGNOSIS — J9610 Chronic respiratory failure, unspecified whether with hypoxia or hypercapnia: Secondary | ICD-10-CM | POA: Diagnosis present

## 2013-02-14 DIAGNOSIS — F319 Bipolar disorder, unspecified: Secondary | ICD-10-CM | POA: Diagnosis present

## 2013-02-14 DIAGNOSIS — L8990 Pressure ulcer of unspecified site, unspecified stage: Secondary | ICD-10-CM | POA: Diagnosis present

## 2013-02-14 DIAGNOSIS — N390 Urinary tract infection, site not specified: Principal | ICD-10-CM | POA: Diagnosis present

## 2013-02-14 DIAGNOSIS — B9689 Other specified bacterial agents as the cause of diseases classified elsewhere: Secondary | ICD-10-CM | POA: Diagnosis present

## 2013-02-14 DIAGNOSIS — G8929 Other chronic pain: Secondary | ICD-10-CM | POA: Diagnosis present

## 2013-02-14 DIAGNOSIS — Z7901 Long term (current) use of anticoagulants: Secondary | ICD-10-CM

## 2013-02-14 DIAGNOSIS — Z79899 Other long term (current) drug therapy: Secondary | ICD-10-CM

## 2013-02-14 DIAGNOSIS — T68XXXA Hypothermia, initial encounter: Secondary | ICD-10-CM

## 2013-02-14 DIAGNOSIS — T68XXXD Hypothermia, subsequent encounter: Secondary | ICD-10-CM

## 2013-02-14 DIAGNOSIS — R680 Hypothermia, not associated with low environmental temperature: Secondary | ICD-10-CM | POA: Diagnosis present

## 2013-02-14 DIAGNOSIS — G8250 Quadriplegia, unspecified: Secondary | ICD-10-CM | POA: Diagnosis present

## 2013-02-14 DIAGNOSIS — M869 Osteomyelitis, unspecified: Secondary | ICD-10-CM | POA: Diagnosis present

## 2013-02-14 DIAGNOSIS — Z933 Colostomy status: Secondary | ICD-10-CM

## 2013-02-14 DIAGNOSIS — L89319 Pressure ulcer of right buttock, unspecified stage: Secondary | ICD-10-CM

## 2013-02-14 DIAGNOSIS — G589 Mononeuropathy, unspecified: Secondary | ICD-10-CM | POA: Diagnosis present

## 2013-02-14 DIAGNOSIS — L89309 Pressure ulcer of unspecified buttock, unspecified stage: Secondary | ICD-10-CM | POA: Diagnosis present

## 2013-02-14 DIAGNOSIS — M542 Cervicalgia: Secondary | ICD-10-CM | POA: Diagnosis present

## 2013-02-14 DIAGNOSIS — R4182 Altered mental status, unspecified: Secondary | ICD-10-CM | POA: Diagnosis present

## 2013-02-14 DIAGNOSIS — Z86711 Personal history of pulmonary embolism: Secondary | ICD-10-CM

## 2013-02-14 DIAGNOSIS — E860 Dehydration: Secondary | ICD-10-CM | POA: Diagnosis present

## 2013-02-14 DIAGNOSIS — L89109 Pressure ulcer of unspecified part of back, unspecified stage: Secondary | ICD-10-CM | POA: Diagnosis present

## 2013-02-14 DIAGNOSIS — K219 Gastro-esophageal reflux disease without esophagitis: Secondary | ICD-10-CM | POA: Diagnosis present

## 2013-02-14 DIAGNOSIS — L8993 Pressure ulcer of unspecified site, stage 3: Secondary | ICD-10-CM | POA: Diagnosis present

## 2013-02-14 DIAGNOSIS — L89899 Pressure ulcer of other site, unspecified stage: Secondary | ICD-10-CM | POA: Diagnosis present

## 2013-02-14 DIAGNOSIS — F411 Generalized anxiety disorder: Secondary | ICD-10-CM | POA: Diagnosis present

## 2013-02-14 HISTORY — DX: Pressure ulcer of unspecified site, unspecified stage: L89.90

## 2013-02-14 HISTORY — DX: Quadriplegia, C1-C4 complete: G82.51

## 2013-02-14 HISTORY — DX: Chronic respiratory failure, unspecified whether with hypoxia or hypercapnia: J96.10

## 2013-02-14 HISTORY — DX: Anxiety disorder, unspecified: F41.9

## 2013-02-14 HISTORY — DX: Gastro-esophageal reflux disease without esophagitis: K21.9

## 2013-02-14 HISTORY — DX: Bipolar disorder, unspecified: F31.9

## 2013-02-14 HISTORY — DX: Depression, unspecified: F32.A

## 2013-02-14 HISTORY — DX: Major depressive disorder, single episode, unspecified: F32.9

## 2013-02-14 LAB — COMPREHENSIVE METABOLIC PANEL
ALT: 99 U/L — ABNORMAL HIGH (ref 0–53)
AST: 112 U/L — ABNORMAL HIGH (ref 0–37)
Albumin: 3.2 g/dL — ABNORMAL LOW (ref 3.5–5.2)
Alkaline Phosphatase: 57 U/L (ref 39–117)
BUN: 18 mg/dL (ref 6–23)
Chloride: 104 mEq/L (ref 96–112)
Potassium: 4.4 mEq/L (ref 3.5–5.1)
Sodium: 141 mEq/L (ref 135–145)
Total Bilirubin: 0.3 mg/dL (ref 0.3–1.2)

## 2013-02-14 LAB — URINE MICROSCOPIC-ADD ON

## 2013-02-14 LAB — URINALYSIS, ROUTINE W REFLEX MICROSCOPIC
Bilirubin Urine: NEGATIVE
Glucose, UA: NEGATIVE mg/dL
Ketones, ur: NEGATIVE mg/dL
Protein, ur: 30 mg/dL — AB

## 2013-02-14 LAB — CBC
MCH: 31.5 pg (ref 26.0–34.0)
MCHC: 33.7 g/dL (ref 30.0–36.0)
MCV: 93.4 fL (ref 78.0–100.0)
Platelets: 177 10*3/uL (ref 150–400)
RDW: 14.7 % (ref 11.5–15.5)

## 2013-02-14 LAB — AMMONIA: Ammonia: 47 umol/L (ref 11–60)

## 2013-02-14 MED ORDER — QUETIAPINE FUMARATE 400 MG PO TABS
400.0000 mg | ORAL_TABLET | Freq: Every day | ORAL | Status: DC
  Administered 2013-02-14: 400 mg via ORAL
  Filled 2013-02-14 (×2): qty 1

## 2013-02-14 MED ORDER — OXYCODONE HCL 10 MG PO TABS: 10.0000 mg | ORAL_TABLET | Freq: Once | ORAL | Status: DC

## 2013-02-14 MED ORDER — DEXTROSE 5 % IV SOLN
1.0000 g | Freq: Once | INTRAVENOUS | Status: AC
  Administered 2013-02-14: 1 g via INTRAVENOUS
  Filled 2013-02-14: qty 10

## 2013-02-14 MED ORDER — ERGOCALCIFEROL 1.25 MG (50000 UT) PO CAPS
50000.0000 [IU] | ORAL_CAPSULE | ORAL | Status: DC
  Administered 2013-02-14: 50000 [IU] via ORAL
  Filled 2013-02-14: qty 1

## 2013-02-14 MED ORDER — DEXTROSE 5 % IV SOLN
1.0000 g | Freq: Three times a day (TID) | INTRAVENOUS | Status: DC
  Filled 2013-02-14 (×2): qty 1

## 2013-02-14 MED ORDER — FUROSEMIDE 40 MG PO TABS
40.0000 mg | ORAL_TABLET | Freq: Every day | ORAL | Status: DC
  Administered 2013-02-15: 40 mg via ORAL
  Filled 2013-02-14: qty 1

## 2013-02-14 MED ORDER — ALBUTEROL SULFATE (5 MG/ML) 0.5% IN NEBU: 2.5000 mg | INHALATION_SOLUTION | RESPIRATORY_TRACT | Status: DC | PRN

## 2013-02-14 MED ORDER — ADULT MULTIVITAMIN W/MINERALS CH
1.0000 | ORAL_TABLET | Freq: Every day | ORAL | Status: DC
  Administered 2013-02-15: 1 via ORAL
  Filled 2013-02-14: qty 1

## 2013-02-14 MED ORDER — BACLOFEN 20 MG PO TABS
30.0000 mg | ORAL_TABLET | Freq: Three times a day (TID) | ORAL | Status: DC
  Administered 2013-02-14 – 2013-02-15 (×3): 30 mg via ORAL
  Filled 2013-02-14 (×5): qty 1

## 2013-02-14 MED ORDER — OXYBUTYNIN CHLORIDE ER 5 MG PO TB24
5.0000 mg | ORAL_TABLET | Freq: Two times a day (BID) | ORAL | Status: DC
  Administered 2013-02-14 – 2013-02-15 (×2): 5 mg via ORAL
  Filled 2013-02-14 (×3): qty 1

## 2013-02-14 MED ORDER — DABIGATRAN ETEXILATE MESYLATE 150 MG PO CAPS
150.0000 mg | ORAL_CAPSULE | Freq: Two times a day (BID) | ORAL | Status: DC
  Administered 2013-02-14 – 2013-02-15 (×2): 150 mg via ORAL
  Filled 2013-02-14 (×3): qty 1

## 2013-02-14 MED ORDER — ONDANSETRON HCL 4 MG/2ML IJ SOLN: 4.0000 mg | Freq: Four times a day (QID) | INTRAMUSCULAR | Status: DC | PRN

## 2013-02-14 MED ORDER — FAMOTIDINE 20 MG PO TABS
20.0000 mg | ORAL_TABLET | Freq: Every day | ORAL | Status: DC
  Administered 2013-02-14: 20 mg via ORAL
  Filled 2013-02-14 (×2): qty 1

## 2013-02-14 MED ORDER — SODIUM CHLORIDE 0.9 % IV SOLN
INTRAVENOUS | Status: AC
  Administered 2013-02-14: 19:00:00 via INTRAVENOUS

## 2013-02-14 MED ORDER — DIVALPROEX SODIUM 250 MG PO DR TAB: 250.0000 mg | DELAYED_RELEASE_TABLET | Freq: Two times a day (BID) | ORAL | Status: DC

## 2013-02-14 MED ORDER — ACETAMINOPHEN 325 MG PO TABS: 650.0000 mg | ORAL_TABLET | Freq: Four times a day (QID) | ORAL | Status: DC | PRN

## 2013-02-14 MED ORDER — DEXTROSE 5 % IV SOLN
2.0000 g | Freq: Two times a day (BID) | INTRAVENOUS | Status: DC
  Administered 2013-02-14 – 2013-02-15 (×2): 2 g via INTRAVENOUS
  Filled 2013-02-14 (×4): qty 2

## 2013-02-14 MED ORDER — LEVOFLOXACIN IN D5W 750 MG/150ML IV SOLN: 750.0000 mg | Freq: Once | INTRAVENOUS | Status: DC

## 2013-02-14 MED ORDER — MAGNESIUM OXIDE 400 MG PO TABS
400.0000 mg | ORAL_TABLET | Freq: Every day | ORAL | Status: DC
  Administered 2013-02-15: 400 mg via ORAL
  Filled 2013-02-14: qty 1

## 2013-02-14 MED ORDER — ACETAMINOPHEN 650 MG RE SUPP: 650.0000 mg | Freq: Four times a day (QID) | RECTAL | Status: DC | PRN

## 2013-02-14 MED ORDER — CEFTRIAXONE SODIUM 1 G IJ SOLR: 1.0000 g | Freq: Once | INTRAMUSCULAR | Status: DC

## 2013-02-14 MED ORDER — FLUOXETINE HCL 40 MG PO CAPS: 40.0000 mg | ORAL_CAPSULE | Freq: Every day | ORAL | Status: DC

## 2013-02-14 MED ORDER — GABAPENTIN 400 MG PO CAPS
800.0000 mg | ORAL_CAPSULE | Freq: Three times a day (TID) | ORAL | Status: DC
  Administered 2013-02-14 – 2013-02-15 (×2): 800 mg via ORAL
  Filled 2013-02-14 (×3): qty 2

## 2013-02-14 MED ORDER — HYDROMORPHONE HCL PF 1 MG/ML IJ SOLN
0.5000 mg | INTRAMUSCULAR | Status: DC | PRN
  Administered 2013-02-14 – 2013-02-15 (×5): 0.5 mg via INTRAVENOUS
  Filled 2013-02-14 (×5): qty 1

## 2013-02-14 MED ORDER — OXYCODONE HCL 5 MG PO TABS
10.0000 mg | ORAL_TABLET | Freq: Once | ORAL | Status: AC
  Administered 2013-02-14: 10 mg via ORAL
  Filled 2013-02-14: qty 2

## 2013-02-14 MED ORDER — GABAPENTIN 800 MG PO TABS
800.0000 mg | ORAL_TABLET | Freq: Three times a day (TID) | ORAL | Status: DC
  Filled 2013-02-14 (×2): qty 1

## 2013-02-14 MED ORDER — HYDROCODONE-ACETAMINOPHEN 5-325 MG PO TABS: 1.0000 | ORAL_TABLET | Freq: Four times a day (QID) | ORAL | Status: DC | PRN

## 2013-02-14 MED ORDER — SODIUM CHLORIDE 0.9 % IV BOLUS (SEPSIS)
1000.0000 mL | INTRAVENOUS | Status: AC
  Administered 2013-02-14: 1000 mL via INTRAVENOUS

## 2013-02-14 MED ORDER — TEMAZEPAM 7.5 MG PO CAPS
7.5000 mg | ORAL_CAPSULE | Freq: Every day | ORAL | Status: DC
  Administered 2013-02-14: 7.5 mg via ORAL
  Filled 2013-02-14: qty 1

## 2013-02-14 MED ORDER — FERROUS SULFATE 325 (65 FE) MG PO TABS
325.0000 mg | ORAL_TABLET | Freq: Every day | ORAL | Status: DC
  Administered 2013-02-15: 325 mg via ORAL
  Filled 2013-02-14 (×2): qty 1

## 2013-02-14 MED ORDER — VITAMIN C 500 MG PO TABS
500.0000 mg | ORAL_TABLET | Freq: Two times a day (BID) | ORAL | Status: DC
Start: 2013-02-14 — End: 2013-02-15
  Administered 2013-02-14 – 2013-02-15 (×2): 500 mg via ORAL
  Filled 2013-02-14 (×3): qty 1

## 2013-02-14 MED ORDER — DIVALPROEX SODIUM 500 MG PO DR TAB
500.0000 mg | DELAYED_RELEASE_TABLET | Freq: Two times a day (BID) | ORAL | Status: DC
  Administered 2013-02-14 – 2013-02-15 (×2): 500 mg via ORAL
  Filled 2013-02-14 (×3): qty 1

## 2013-02-14 MED ORDER — TEMAZEPAM 7.5 MG PO CAPS: 7.5000 mg | ORAL_CAPSULE | Freq: Every evening | ORAL | Status: DC | PRN

## 2013-02-14 MED ORDER — FLUOXETINE HCL 20 MG PO CAPS
60.0000 mg | ORAL_CAPSULE | Freq: Every day | ORAL | Status: DC
  Administered 2013-02-15: 60 mg via ORAL
  Filled 2013-02-14: qty 3

## 2013-02-14 MED ORDER — DIAZEPAM 5 MG PO TABS
10.0000 mg | ORAL_TABLET | Freq: Three times a day (TID) | ORAL | Status: DC | PRN
  Administered 2013-02-15: 10 mg via ORAL
  Filled 2013-02-14: qty 2

## 2013-02-14 MED ORDER — POTASSIUM CHLORIDE CRYS ER 20 MEQ PO TBCR
20.0000 meq | EXTENDED_RELEASE_TABLET | Freq: Two times a day (BID) | ORAL | Status: DC
  Administered 2013-02-14 – 2013-02-15 (×2): 20 meq via ORAL
  Filled 2013-02-14 (×3): qty 1

## 2013-02-14 MED ORDER — LORATADINE 10 MG PO TABS
10.0000 mg | ORAL_TABLET | Freq: Every day | ORAL | Status: DC
  Administered 2013-02-15: 10 mg via ORAL
  Filled 2013-02-14: qty 1

## 2013-02-14 MED ORDER — CALCIUM CARBONATE ANTACID 500 MG PO CHEW
1.0000 | CHEWABLE_TABLET | Freq: Three times a day (TID) | ORAL | Status: DC
  Administered 2013-02-14 – 2013-02-15 (×2): 200 mg via ORAL
  Filled 2013-02-14 (×3): qty 2
  Filled 2013-02-14: qty 1

## 2013-02-14 MED ORDER — ONDANSETRON HCL 4 MG PO TABS: 4.0000 mg | ORAL_TABLET | Freq: Four times a day (QID) | ORAL | Status: DC | PRN

## 2013-02-14 NOTE — H&P (Signed)
[facility] History and Physical  Alan Sanford OZH:[id number] DOB: June 03, 1986 DOA: 02/14/2013  Referring physician: EDP PCP: [name], MD  Specialists:  None  Chief Complaint: Altered Mental Status  HPI: Alan Sanford is a 27 y.o. male , SNF resident, C4 quadriplegia, depression & bipolar disorder, indwelling Foley catheter, recurrent urinary tract infections (Klebsiella & most recently Pseudomonas), bacteremia attributed to PICC line, osteomyelitis, colostomy, anemia and pulmonary embolism on Pradaxa was transferred to the ED due to altered mental status. Staff called EMS due to patient being unresponsive but on EMS arrival patient was responsive, alert and talking normally. However stop stated that he was "not acting right". Patient does not recollect any of these events. Currently he complains of chronic neck pain and feeling thirsty. No reported seizure-like activity. In the ED, patient was initially hypothermic but has since resolved, UA +. Mild transaminitis noted. Other vital signs and labs are unremarkable. Hospitalist admission requested.   Review of Systems: All systems reviewed and apart from history of presenting illness, are negative  Past Medical History  Diagnosis Date  . Quadriplegia, C1-C4 complete   . Bipolar 1 disorder   . Respiratory failure, chronic   . GERD (gastroesophageal reflux disease)   . Pressure ulcer   . Anxiety   . Depression    Past Surgical History  Procedure Laterality Date  . Colostomy     Social History:  reports that he has never smoked. He does not have any smokeless tobacco history on file. He reports that he does not drink alcohol or use illicit drugs. Patient states that he occasionally drinks a beer when he goes home once every 2 weeks.  Allergies  Allergen Reactions  . Cefepime Other (See Comments)    Itching (pt states he can take)  . Sulfa Antibiotics Other (See Comments)    Unknown   . Xarelto [Rivaroxaban] Other  (See Comments)    Unknown     History reviewed. No pertinent family history. family history negative.  Prior to Admission medications   Medication Sig Start Date End Date Taking? Authorizing Provider  baclofen (LIORESAL) 20 MG tablet Take 30 mg by mouth 3 (three) times daily.   Yes Historical Provider, MD  calcium carbonate (TUMS - DOSED IN MG ELEMENTAL CALCIUM) 500 MG chewable tablet Chew 1-2 tablets by mouth 3 (three) times daily. 1000 mg at 0900 and 2100 and 500 mg at 1300   Yes Historical Provider, MD  dabigatran (PRADAXA) 150 MG CAPS capsule Take 150 mg by mouth every 12 (twelve) hours.   Yes Historical Provider, MD  diazepam (VALIUM) 10 MG tablet Take 1 tablet (10 mg total) by mouth every 8 (eight) hours as needed for anxiety. 11/10/12  Yes Tiffany L Reed, DO  divalproex (DEPAKOTE) 250 MG DR tablet Take 250 mg by mouth 2 (two) times daily. 250 mg in addition to 500 mg to equal 750 mg twice daily   Yes Historical Provider, MD  divalproex (DEPAKOTE) 500 MG DR tablet Take 500 mg by mouth 2 (two) times daily. Take 500 mg in addition to 250 mg to equal 750 mg twice daily   Yes Historical Provider, MD  ergocalciferol (VITAMIN D2) 50000 UNITS capsule Take 50,000 Units by mouth every Monday, Wednesday, and Friday.   Yes Historical Provider, MD  famotidine (PEPCID) 20 MG tablet Take 20 mg by mouth at bedtime.   Yes Historical Provider, MD  ferrous sulfate 325 (65 FE) MG tablet Take 325 mg by mouth daily with breakfast.  Yes Historical Provider, MD  FLUoxetine (PROZAC) 20 MG capsule Take 20 mg by mouth daily. 20 mg daily in addition to 40 mg to equal 60 mg daily   Yes Historical Provider, MD  FLUoxetine (PROZAC) 40 MG capsule Take 40 mg by mouth daily. 40 mg in addition to 20 mg to equal 60 mg daily   Yes Historical Provider, MD  furosemide (LASIX) 40 MG tablet Take 40 mg by mouth daily.   Yes Historical Provider, MD  gabapentin (NEURONTIN) 800 MG tablet Take 800 mg by mouth 3 (three) times daily.    Yes Historical Provider, MD  loratadine (CLARITIN) 10 MG tablet Take 10 mg by mouth daily.   Yes Historical Provider, MD  magnesium oxide (MAG-OX) 400 MG tablet Take 400 mg by mouth daily.   Yes Historical Provider, MD  Multiple Vitamin (MULTIVITAMIN WITH MINERALS) TABS tablet Take 1 tablet by mouth daily.   Yes Historical Provider, MD  oxybutynin (DITROPAN-XL) 5 MG 24 hr tablet Take 5 mg by mouth 2 (two) times daily.   Yes Historical Provider, MD  Oxycodone HCl 10 MG TABS Take one tablet by mouth every 4 hours as needed for moderate to sever pain. 12/22/12  Yes Tiffany L Reed, DO  potassium chloride SA (K-DUR,KLOR-CON) 20 MEQ tablet Take 20 mEq by mouth 2 (two) times daily.   Yes Historical Provider, MD  QUEtiapine (SEROQUEL) 400 MG tablet Take 400 mg by mouth at bedtime.   Yes Historical Provider, MD  temazepam (RESTORIL) 7.5 MG capsule Take 7.5 mg by mouth at bedtime.   Yes Historical Provider, MD  vitamin C (ASCORBIC ACID) 500 MG tablet Take 500 mg by mouth 2 (two) times daily.   Yes Historical Provider, MD   Physical Exam: Filed Vitals:   02/14/13 1238 02/14/13 1245 02/14/13 1400 02/14/13 1551  BP:  101/74 102/79 121/85  Pulse:  65 62 75  Temp: 96.4 F (35.8 C)   97.9 F (36.6 C)  TempSrc: Rectal   Oral  Resp:    18  SpO2:  98% 97% 97%     General exam: Moderately built and nourished male patient, lying comfortably supine on the gurney in no obvious distress.  Head, eyes and ENT: Nontraumatic and normocephalic. Pupils equally reacting to light and accommodation. Oral mucosa dry.  Neck: Supple. No JVD, carotid bruit or thyromegaly.  Lymphatics: No lymphadenopathy.  Respiratory system: Slightly reduced breath sounds in the bases but otherwise clear to auscultation. No increased work of breathing.  Cardiovascular system: S1 and S2 heard, RRR. No JVD, murmurs, gallops, clicks or pedal edema.  Gastrointestinal system: Abdomen is nondistended, soft and nontender. Normal bowel  sounds heard. No organomegaly or masses appreciated. Left sided colostomy draining formed green stools.  Central nervous system: Alert and oriented x3. No focal neurological deficits.  Extremities: Contractures of upper extremities & 0/5 power all limbs. Peripheral pulses symmetrically felt.  Skin: 2 pressure ulcers on the sacral and right posterior thigh-dressing clean and dry-will defer to wound care nursing to evaluate.  Musculoskeletal system: Negative exam.  Psychiatry: Pleasant and cooperative.   Labs on Admission:  Basic Metabolic Panel:  Recent Labs Lab 02/14/13 1407  NA 141  K 4.4  CL 104  CO2 25  GLUCOSE 85  BUN 18  CREATININE 0.23*  CALCIUM 9.2   Liver Function Tests:  Recent Labs Lab 02/14/13 1407  AST 112*  ALT 99*  ALKPHOS 57  BILITOT 0.3  PROT 7.3  ALBUMIN 3.2*   No  results found for this basename: LIPASE, AMYLASE,  in the last 168 hours No results found for this basename: AMMONIA,  in the last 168 hours CBC:  Recent Labs Lab 02/14/13 1407  WBC 5.5  HGB 14.7  HCT 43.6  MCV 93.4  PLT 177   Cardiac Enzymes: No results found for this basename: CKTOTAL, CKMB, CKMBINDEX, TROPONINI,  in the last 168 hours  BNP (last 3 results) No results found for this basename: PROBNP,  in the last 8760 hours CBG: No results found for this basename: GLUCAP,  in the last 168 hours  Radiological Exams on Admission: Dg Chest Portable 1 View  02/14/2013   *RADIOLOGY REPORT*  Clinical Data: Altered mental status question fever  PORTABLE CHEST - 1 VIEW  Comparison: Portable exam 1243 hours without priors for comparison.  Findings: Upper normal heart size. Mediastinal contours and pulmonary vascularity normal. Mild subsegmental atelectasis at bases. Lungs otherwise clear. No pleural effusion or pneumothorax. Bones unremarkable.  IMPRESSION: Mild subsegmental atelectasis at lung bases.   Original Report Authenticated By: Ulyses Southward, M.D.    Assessment/Plan Active  Problems:   Quadriplegia   Other pulmonary embolism and infarction   Bipolar disorder, unspecified   UTI (urinary tract infection)   Altered mental status   Dehydration   Hypothermia   Decubitus ulcer of buttock & right posterior thigh   1. Recurrent complicated UTI: Patient states that he's had multiple UTIs in the past. He also indicates that his Foley catheter was changed recently within a week. Patient had prior urine cultures with Pseudomonas and has tolerated cefepime in the past. Followup urine and blood culture results. Continue IV cefepime. 2. Altered mental status: DD-UTI, polypharmacy. Improved/resolved. Monitor closely. Check ammonia levels. No focal deficits. 3. Hypothermia: May be secondary to problem #1. Improved/resolved. Monitor. 4. Dehydration: Brief IV fluids. 5. Mild transaminitis: Patient is an Depakote. Follow LFTs closely. No GI symptoms. 6. 2 pressure ulcers-sacrum & right posterior thigh: Wound care consultation. 7. C4 quadriplegia/colostomy/neuropathy: Stable. 8. Bipolar disorder: Patient on multiple medications at nursing facility. Continue same and monitor closely. 9. History of pulmonary embolism: Continue Pradaxa.    Code Status: Full  Family Communication: None  Disposition Plan: Return to SNF when medically stable.   Time spent: 65 minutes  [facility] Pager [phone]  If 7PM-7AM, please contact night-coverage [email or web address] Password [facility] 02/14/2013, 4:32 PM

## 2013-02-14 NOTE — ED Notes (Signed)
Pt in via EMS, per EMS- they were called out due to patient being unresponsive, upon EMS arrival pt was responsive and alert, talking per normal, per staff patient isn't "acting right" but they were unable to explain what was different. Pt VS within normal limits, pt oriented, no distress noted, pt denies complaints at this time.

## 2013-02-14 NOTE — ED Notes (Signed)
Attempted to call report x1, stated they would call back 

## 2013-02-14 NOTE — Progress Notes (Signed)
Pharmacy Consult - Cefepime  With indwelling foley, UTI Patient states he can take cefepime despite allergy listed as itching Scr stable.  Plan: 1) Cefepime 1 Gram iv Q 8 hours 2) Pharmacy will sign off.  Thank you. Okey Regal, PharmD [phone]

## 2013-02-14 NOTE — Progress Notes (Signed)
ANTIBIOTIC CONSULT NOTE - INITIAL  Pharmacy Consult for Cefepime Indication: Recurrent UTI- Pseudomonas in past  Allergies  Allergen Reactions  . Cefepime Other (See Comments)    Itching (pt states he can take)  . Sulfa Antibiotics Other (See Comments)    Unknown   . Xarelto [Rivaroxaban] Other (See Comments)    Unknown    Patient Measurements:    Vital Signs: Temp: 97.9 F (36.6 C) (08/20 1551) Temp src: Oral (08/20 1551) BP: 121/85 mmHg (08/20 1551) Pulse Rate: 75 (08/20 1551)  Labs:  Recent Labs  02/14/13 1407  WBC 5.5  HGB 14.7  PLT 177  CREATININE 0.23*   CrCl is unknown because there is no height on file for the current visit.  Microbiology: No results found for this or any previous visit (from the past 720 hour(s)).  Medical History: Past Medical History  Diagnosis Date  . Quadriplegia, C1-C4 complete   . Bipolar 1 disorder   . Respiratory failure, chronic   . GERD (gastroesophageal reflux disease)   . Pressure ulcer   . Anxiety   . Depression    Assessment: 27 yr old male with quadriplegia and indwelling foley with history of recurrent UTIs (klebsiella and pseumdonas) admitted 02/14/13 with a positive UA to start Cefepime for UTI. Patient notes itching with Cefepime in the past but states that he can take and has tolerated this medication in the past.   Current SCr is 0.23 (however, likely falsely low with decreased muscle mass from paralysis). WBC is 5.5. LA is 1.7.   Blood and urine cultures are pending.   Goal of Therapy:  Clinical resolution of infection  Plan:  1. Adjust Cefepime 2g IV q12h for severe UTI dosing. 2. Monitor renal function and need to adjust therapy (SCr likely falsely low due to paralysis and decreased muscle mass).    Link Snuffer, PharmD, BCPS Clinical Pharmacist [phone] 02/14/2013,5:46 PM

## 2013-02-14 NOTE — ED Provider Notes (Signed)
Medical screening examination/treatment/procedure(s) were conducted as a shared visit with non-physician practitioner(s) and myself.  I personally evaluated the patient during the encounter  Patient presents with AMS from facility.  Patient with contractures and indwelling foley.  UTI on urinalysis.  Patient given IV abx and foley changed.  Patient will be admitted for IV abx.  Shon Baton, MD 02/14/13 [phone]

## 2013-02-14 NOTE — ED Provider Notes (Signed)
CSN: [id number]     Arrival date & time 02/14/13  1146 History     First MD Initiated Contact with Patient 02/14/13 1204     Chief Complaint  Patient presents with  . Altered Mental Status   (Consider location/radiation/quality/duration/timing/severity/associated sxs/prior Treatment) HPI Pt is a 27yo male with quadriplegia due to C1-C4 injury, BIB ems from nursing home for altered mental status.  Hx of recurrent UTIs.  Staff called EMS due to pt being unresponsive, upon EMS arrival, pt was responsive and alert, talking per normal.  Staff stated pt was "not acting right" but were unable to explain what was different.  Pt was found to have VS WNL, oriented, not distressed. Pt communication is limited.  Speaks in short phrases, poor historian.  Knows he is at [facility] but unsure where he came from.  States his neck hurts, it has hurt for a while, takes norco every 4 hours as needed.  States he has not taking more than normal.  Denies fever, n/v. Denies other complaints at this time.   Past Medical History  Diagnosis Date  . Quadriplegia, C1-C4 complete   . Bipolar 1 disorder   . Respiratory failure, chronic   . GERD (gastroesophageal reflux disease)   . Pressure ulcer   . Anxiety   . Depression    Past Surgical History  Procedure Laterality Date  . Colostomy     History reviewed. No pertinent family history. History  Substance Use Topics  . Smoking status: Not on file  . Smokeless tobacco: Not on file  . Alcohol Use: Not on file    Review of Systems  Unable to perform ROS: Mental status change    Allergies  Cefepime; Sulfa antibiotics; and Xarelto  Home Medications   Current Outpatient Rx  Name  Route  Sig  Dispense  Refill  . baclofen (LIORESAL) 20 MG tablet   Oral   Take 30 mg by mouth 3 (three) times daily.         . calcium carbonate (TUMS - DOSED IN MG ELEMENTAL CALCIUM) 500 MG chewable tablet   Oral   Chew 1-2 tablets by mouth 3 (three) times daily. 1000 mg at  0900 and 2100 and 500 mg at 1300         . dabigatran (PRADAXA) 150 MG CAPS capsule   Oral   Take 150 mg by mouth every 12 (twelve) hours.         . diazepam (VALIUM) 10 MG tablet   Oral   Take 1 tablet (10 mg total) by mouth every 8 (eight) hours as needed for anxiety.   90 tablet   5   . divalproex (DEPAKOTE) 250 MG DR tablet   Oral   Take 250 mg by mouth 2 (two) times daily. 250 mg in addition to 500 mg to equal 750 mg twice daily         . divalproex (DEPAKOTE) 500 MG DR tablet   Oral   Take 500 mg by mouth 2 (two) times daily. Take 500 mg in addition to 250 mg to equal 750 mg twice daily         . ergocalciferol (VITAMIN D2) 50000 UNITS capsule   Oral   Take 50,000 Units by mouth every Monday, Wednesday, and Friday.         . famotidine (PEPCID) 20 MG tablet   Oral   Take 20 mg by mouth at bedtime.         Marland Kitchen  ferrous sulfate 325 (65 FE) MG tablet   Oral   Take 325 mg by mouth daily with breakfast.         . FLUoxetine (PROZAC) 20 MG capsule   Oral   Take 20 mg by mouth daily. 20 mg daily in addition to 40 mg to equal 60 mg daily         . FLUoxetine (PROZAC) 40 MG capsule   Oral   Take 40 mg by mouth daily. 40 mg in addition to 20 mg to equal 60 mg daily         . furosemide (LASIX) 40 MG tablet   Oral   Take 40 mg by mouth daily.         Marland Kitchen gabapentin (NEURONTIN) 800 MG tablet   Oral   Take 800 mg by mouth 3 (three) times daily.         Marland Kitchen loratadine (CLARITIN) 10 MG tablet   Oral   Take 10 mg by mouth daily.         . magnesium oxide (MAG-OX) 400 MG tablet   Oral   Take 400 mg by mouth daily.         . Multiple Vitamin (MULTIVITAMIN WITH MINERALS) TABS tablet   Oral   Take 1 tablet by mouth daily.         Marland Kitchen oxybutynin (DITROPAN-XL) 5 MG 24 hr tablet   Oral   Take 5 mg by mouth 2 (two) times daily.         . Oxycodone HCl 10 MG TABS      Take one tablet by mouth every 4 hours as needed for moderate to sever pain.    180 tablet   0   . potassium chloride SA (K-DUR,KLOR-CON) 20 MEQ tablet   Oral   Take 20 mEq by mouth 2 (two) times daily.         . QUEtiapine (SEROQUEL) 400 MG tablet   Oral   Take 400 mg by mouth at bedtime.         . temazepam (RESTORIL) 7.5 MG capsule   Oral   Take 7.5 mg by mouth at bedtime.         . vitamin C (ASCORBIC ACID) 500 MG tablet   Oral   Take 500 mg by mouth 2 (two) times daily.          BP 121/85  Pulse 75  Temp(Src) 97.9 F (36.6 C) (Oral)  Resp 18  SpO2 97% Physical Exam  Nursing note and vitals reviewed. Constitutional:  Pt lying in exam bed, NAD.  HENT:  Head: Normocephalic and atraumatic.  Eyes: Conjunctivae and EOM are normal. Pupils are equal, round, and reactive to light. Right eye exhibits no discharge. Left eye exhibits no discharge. No scleral icterus.  Neck:  Limited neck rotation. Mild tenderness in left cervical paraspinal muscles and SCM  Cardiovascular: Normal rate, regular rhythm and normal heart sounds.   Pulmonary/Chest: Effort normal and breath sounds normal. No respiratory distress. He has no wheezes. He has no rales. He exhibits no tenderness.  Harsh breath sounds.  Abdominal: Soft. Bowel sounds are normal. He exhibits no distension and no mass. There is no tenderness. There is no rebound and no guarding.  Colostomy bag in place. No surrounding erythema or warmth.   Genitourinary:  Indwelling foley catheter  Musculoskeletal:  Bilateral arm and hand contractures.  Bilateral leg contractures.  Neurological: He is alert.  Skin: Skin is warm and  dry.    ED Course   Procedures (including critical care time)  Labs Reviewed  COMPREHENSIVE METABOLIC PANEL - Abnormal; Notable for the following:    Creatinine, Ser 0.23 (*)    Albumin 3.2 (*)    AST 112 (*)    ALT 99 (*)    All other components within normal limits  URINALYSIS, ROUTINE W REFLEX MICROSCOPIC - Abnormal; Notable for the following:    APPearance CLOUDY (*)     Hgb urine dipstick TRACE (*)    Protein, ur 30 (*)    Nitrite POSITIVE (*)    Leukocytes, UA LARGE (*)    All other components within normal limits  URINE MICROSCOPIC-ADD ON - Abnormal; Notable for the following:    Bacteria, UA MANY (*)    Crystals TRIPLE PHOSPHATE CRYSTALS (*)    All other components within normal limits  URINE CULTURE  CULTURE, BLOOD (ROUTINE X 2)  CULTURE, BLOOD (ROUTINE X 2)  CBC  CG4 I-STAT (LACTIC ACID)   Dg Chest Portable 1 View  02/14/2013   *RADIOLOGY REPORT*  Clinical Data: Altered mental status question fever  PORTABLE CHEST - 1 VIEW  Comparison: Portable exam 1243 hours without priors for comparison.  Findings: Upper normal heart size. Mediastinal contours and pulmonary vascularity normal. Mild subsegmental atelectasis at bases. Lungs otherwise clear. No pleural effusion or pneumothorax. Bones unremarkable.  IMPRESSION: Mild subsegmental atelectasis at lung bases.   Original Report Authenticated By: Ulyses Southward, M.D.   1. UTI (lower urinary tract infection)   2. Altered mental status     MDM  Pt quadrapalegic from C4 injury, BIB ems for altered mental status.  Infection workup.  UA: significant UTI, pt has indwelling foley catheter that will need to be change.  Considered complicated UTI, pt will need admittance for IV antibiotics.  Started on rocephin in ED and given fluid bolus.   Consulting hospitalist: Consulted Dr. Waymon Amato, will get blood cultures, start on cefapime, and repeat rectal temp to determine bed placement.   3:58 PM Tech stated she was unable to obtain rectal temp however did get oral temp: 97.9.  Will admit pt to MedSurg team 10.      Junius Finner, PA-C 02/14/13 1559

## 2013-02-14 NOTE — ED Notes (Signed)
Warms blankets applied to patient

## 2013-02-15 ENCOUNTER — Encounter (HOSPITAL_COMMUNITY): Payer: Self-pay | Admitting: General Practice

## 2013-02-15 DIAGNOSIS — Z5189 Encounter for other specified aftercare: Secondary | ICD-10-CM

## 2013-02-15 LAB — HEPATIC FUNCTION PANEL
ALT: 95 U/L — ABNORMAL HIGH (ref 0–53)
AST: 117 U/L — ABNORMAL HIGH (ref 0–37)
Alkaline Phosphatase: 52 U/L (ref 39–117)
Bilirubin, Direct: <0.1 mg/dL (ref 0.0–0.3)
Total Bilirubin: 0.2 mg/dL — ABNORMAL LOW (ref 0.3–1.2)

## 2013-02-15 LAB — CBC
Hemoglobin: 13.2 g/dL (ref 13.0–17.0)
MCH: 31.7 pg (ref 26.0–34.0)
MCHC: 33.8 g/dL (ref 30.0–36.0)
RDW: 14.7 % (ref 11.5–15.5)

## 2013-02-15 LAB — MRSA PCR SCREENING: MRSA by PCR: POSITIVE — AB

## 2013-02-15 MED ORDER — SODIUM CHLORIDE 0.9 % IV SOLN: INTRAVENOUS | Status: DC

## 2013-02-15 MED ORDER — SODIUM CHLORIDE 0.9 % IV BOLUS (SEPSIS)
500.0000 mL | Freq: Once | INTRAVENOUS | Status: AC
  Administered 2013-02-15: 500 mL via INTRAVENOUS

## 2013-02-15 MED ORDER — DEXTROSE 5 % IV SOLN: 2.0000 g | Freq: Two times a day (BID) | INTRAVENOUS | Status: DC

## 2013-02-15 MED ORDER — CHLORHEXIDINE GLUCONATE CLOTH 2 % EX PADS: 6.0000 | MEDICATED_PAD | Freq: Every day | CUTANEOUS | Status: DC

## 2013-02-15 MED ORDER — MUPIROCIN 2 % EX OINT: 1.0000 "application " | TOPICAL_OINTMENT | Freq: Two times a day (BID) | CUTANEOUS | Status: DC

## 2013-02-15 NOTE — Discharge Summary (Signed)
Physician Discharge Summary  Alan Sanford ZOX:[id number] DOB: 1986-06-20 DOA: 02/14/2013  PCP: [name], MD  Admit date: 02/14/2013 Discharge date: 02/15/2013  Time spent: Greater than 30 minutes  Recommendations for Outpatient Follow-up:  1. Dr. Baltazar Najjar, PCP: Follow up urine and blood culture results that were drawn in the hospital on 02/14/13 and determine choice and duration of antibiotics. 2. Follow up LFT's closely & consider reducing dose of Depakote which may be contributing to abnormal LFT's. 3. Recommend OP Psychiatric consultation. 4. Keep Urology appointment that he apparently has at [facility] next week Tuesday. 5. Continue wound care as prior to hospital admission.  Discharge Diagnoses:  Active Problems:   Quadriplegia   Other pulmonary embolism and infarction   Bipolar disorder, unspecified   UTI (urinary tract infection)   Altered mental status   Dehydration   Hypothermia   Decubitus ulcer of buttock & right posterior thigh   Discharge Condition: Improved & Stable  Diet recommendation: Regular diet.  Filed Weights   02/15/13 0900  Weight: 82.555 kg (182 lb)    History of present illness:  27 y.o. male , SNF resident, C4 quadriplegia, depression & bipolar disorder, indwelling Foley catheter, recurrent urinary tract infections (Klebsiella & most recently Pseudomonas), bacteremia attributed to PICC line, osteomyelitis, colostomy, anemia and pulmonary embolism on Pradaxa was transferred to the ED due to altered mental status. Staff called EMS due to patient being unresponsive but on EMS arrival patient was responsive, alert and talking normally. However staff stated that he was "not acting right". Patient does not recollect any of these events. No reported seizure-like activity. In the ED, patient was initially hypothermic but has since resolved, UA +. Mild transaminitis noted. Other vital signs and labs are unremarkable. Hospitalist admission  requested.  Hospital Course:  1. Recurrent complicated UTI/gram-negative rods (preliminary report): Patient states that he's had multiple UTIs in the past. He also indicates that his Foley catheter was changed recently within a week. Patient had prior urine cultures with Pseudomonas and has tolerated cefepime in the past. Patient was started on IV cefepime empirically. Blood cultures x2: Negative to date and urine cultures are still pending. Patient & HCPOA [name] insist on DC today possibly with a PICC line and IV antibiotics. This M.D. had a very long discussion with both and explained to them that until further culture and sensitivity results are available, decision regarding final antibiotics or duration cannot be determined. Thereby, decision regarding PICC line also cannot be made at this time. They insisted that patient be discharged on current IV cefepime regimen via a peripheral IV line and M.D. at SNF will address choice and duration of antibiotics at the SNF. [name] states that she is an ICU nurse at [facility] and apparently has discussed at length this morning with the M.D. and nursing staff at his SNF and they are agreeable to this plan. Clinical social worker also discussed with the nursing facility and they are willing to accept patient with above treatment plan. As per [name], patient has a urology consultation appointment next Tuesday to address recurrent UTI issue. 2. Altered mental status: DD-UTI, polypharmacy. Improved and seems to have resolved. Ammonia levels normal. No focal deficits. Advised patient and health care power of attorney that his polypharmacy may be contributing to this and recommend outpatient psychiatric consultation to optimize/minimize these medications. They verbalized understanding. 3. Hypothermia: May be secondary to problem #1. Resolved. Per [name], patient also runs soft blood  pressures i.e. SBP in the 80's - 90's. 4. Dehydration:  Resolved after brief IV fluid hydration. 5. Mild transaminitis: Patient is an Depakote. No GI symptoms. Advised patient and [name] that his Depakote dose may have to be reduced which has happened in the past. Will defer this to her PCP at [facility] for further management. Recommend outpatient psychiatric consultation. 6. 2 pressure ulcers-sacrum & right posterior thigh: Patient declined wound care nurse to see him and also declined in overlay mattress. He stated that since he is returning to SNF, he doesn't need to have these done here. Continue wound care as prior to admission. 7. C4 quadriplegia/colostomy/neuropathy: Stable. 8. Bipolar disorder: Patient on multiple medications at nursing facility. Please see above recommendations. 9. History of pulmonary embolism: Continue Pradaxa.   Procedures:  No new procedures.   Consultations:  None  Discharge Exam:  Complaints: Denies complaints & insists on returning to SNF today.  Filed Vitals:   02/14/13 2237 02/15/13 0507 02/15/13 0609 02/15/13 0900  BP: 137/89 78/43 82/52    Pulse: 78 88 88   Temp: 97.5 F (36.4 C) 97.8 F (36.6 C)    TempSrc: Oral     Resp: 20 18    Weight:    82.555 kg (182 lb)  SpO2: 96% 94%      General exam: Moderately built and nourished male patient, lying comfortably in bed in no obvious distress.  Respiratory system: Slightly reduced breath sounds in the bases but otherwise clear to auscultation. No increased work of breathing.  Cardiovascular system: S1 and S2 heard, RRR. No JVD, murmurs, gallops, clicks or pedal edema.  Gastrointestinal system: Abdomen is nondistended, soft and nontender. Normal bowel sounds heard. No organomegaly or masses appreciated. Left sided colostomy draining formed green stools. Foley catheter present. Central nervous system: Alert and oriented x3. No focal neurological deficits.  Extremities: Contractures of upper extremities & 0/5 power all limbs. Peripheral pulses symmetrically  felt.  Skin: Declined exam today.  Psychiatry: Pleasant and cooperative.   Discharge Instructions      Discharge Orders   Future Orders Complete By Expires   Bed rest  As directed    Call MD for:  redness, tenderness, or signs of infection (pain, swelling, redness, odor or green/yellow discharge around incision site)  As directed    Call MD for:  severe uncontrolled pain  As directed    Call MD for:  temperature >100.4  As directed    Call MD for:  As directed    Comments:     Altered Mental Status.   Diet general  As directed    Discharge wound care:  As directed    Comments:     Continue wound care as being done before at nursing facility.       Medication List         baclofen 20 MG tablet  Commonly known as:  LIORESAL  Take 30 mg by mouth 3 (three) times daily.     calcium carbonate 500 MG chewable tablet  Commonly known as:  TUMS - dosed in mg elemental calcium  Chew 1-2 tablets by mouth 3 (three) times daily. 1000 mg at 0900 and 2100 and 500 mg at 1300     dabigatran 150 MG Caps capsule  Commonly known as:  PRADAXA  Take 150 mg by mouth every 12 (twelve) hours.     dextrose 5 % SOLN 50 mL with ceFEPIme 2 G SOLR 2 g  Inject 2 g into the  vein every 12 (twelve) hours. Duration to be determined by MD at Skilled Nursing Facility.     diazepam 10 MG tablet  Commonly known as:  VALIUM  Take 1 tablet (10 mg total) by mouth every 8 (eight) hours as needed for anxiety.     divalproex 500 MG DR tablet  Commonly known as:  DEPAKOTE  Take 500 mg by mouth 2 (two) times daily. Take 500 mg in addition to 250 mg to equal 750 mg twice daily     divalproex 250 MG DR tablet  Commonly known as:  DEPAKOTE  Take 250 mg by mouth 2 (two) times daily. 250 mg in addition to 500 mg to equal 750 mg twice daily     ergocalciferol 50000 UNITS capsule  Commonly known as:  VITAMIN D2  Take 50,000 Units by mouth every Monday, Wednesday, and Friday.     famotidine 20 MG tablet   Commonly known as:  PEPCID  Take 20 mg by mouth at bedtime.     ferrous sulfate 325 (65 FE) MG tablet  Take 325 mg by mouth daily with breakfast.     FLUoxetine 40 MG capsule  Commonly known as:  PROZAC  Take 40 mg by mouth daily. 40 mg in addition to 20 mg to equal 60 mg daily     FLUoxetine 20 MG capsule  Commonly known as:  PROZAC  Take 20 mg by mouth daily. 20 mg daily in addition to 40 mg to equal 60 mg daily     furosemide 40 MG tablet  Commonly known as:  LASIX  Take 40 mg by mouth daily.     gabapentin 800 MG tablet  Commonly known as:  NEURONTIN  Take 800 mg by mouth 3 (three) times daily.     loratadine 10 MG tablet  Commonly known as:  CLARITIN  Take 10 mg by mouth daily.     magnesium oxide 400 MG tablet  Commonly known as:  MAG-OX  Take 400 mg by mouth daily.     multivitamin with minerals Tabs tablet  Take 1 tablet by mouth daily.     oxybutynin 5 MG 24 hr tablet  Commonly known as:  DITROPAN-XL  Take 5 mg by mouth 2 (two) times daily.     Oxycodone HCl 10 MG Tabs  Take one tablet by mouth every 4 hours as needed for moderate to sever pain.     potassium chloride SA 20 MEQ tablet  Commonly known as:  K-DUR,KLOR-CON  Take 20 mEq by mouth 2 (two) times daily.     QUEtiapine 400 MG tablet  Commonly known as:  SEROQUEL  Take 400 mg by mouth at bedtime.     temazepam 7.5 MG capsule  Commonly known as:  RESTORIL  Take 7.5 mg by mouth at bedtime.     vitamin C 500 MG tablet  Commonly known as:  ASCORBIC ACID  Take 500 mg by mouth 2 (two) times daily.       Follow-up Information   Schedule an appointment as soon as possible for a visit with [name], MD. (Follow up Urine & Blood Culture results sent from hospital on 02/14/2013 and determine choice and duration of antibiotics.)    Specialty:  Internal Medicine   Contact information:   [address] [place] [address] [phone]        The results of significant diagnostics  from this hospitalization (including imaging, microbiology, ancillary and laboratory) are listed below for  reference.    Significant Diagnostic Studies: Dg Chest Portable 1 View  02/14/2013   *RADIOLOGY REPORT*  Clinical Data: Altered mental status question fever  PORTABLE CHEST - 1 VIEW  Comparison: Portable exam 1243 hours without priors for comparison.  Findings: Upper normal heart size. Mediastinal contours and pulmonary vascularity normal. Mild subsegmental atelectasis at bases. Lungs otherwise clear. No pleural effusion or pneumothorax. Bones unremarkable.  IMPRESSION: Mild subsegmental atelectasis at lung bases.   Original Report Authenticated By: Ulyses Southward, M.D.    Microbiology: Recent Results (from the past 240 hour(s))  URINE CULTURE     Status: None   Collection Time    02/14/13 12:41 PM      Result Value Range Status   Specimen Description URINE, CATHETERIZED   Final   Special Requests NONE   Final   Culture  Setup Time     Final   Value: 02/14/2013 14:24     Performed at [facility] Count     Final   Value: >=100,000 COLONIES/ML     Performed at [facility]   Culture     Final   Value: GRAM NEGATIVE RODS     Performed at [facility]   Report Status PENDING   Incomplete  CULTURE, BLOOD (ROUTINE X 2)     Status: None   Collection Time    02/14/13  4:50 PM      Result Value Range Status   Specimen Description BLOOD THUMB RIGHT   Final   Special Requests BOTTLES DRAWN AEROBIC ONLY 4.5CC   Final   Culture  Setup Time     Final   Value: 02/14/2013 22:19     Performed at [facility]   Culture     Final   Value:        BLOOD CULTURE RECEIVED NO GROWTH TO DATE CULTURE WILL BE HELD FOR 5 DAYS BEFORE ISSUING A FINAL NEGATIVE REPORT     Performed at [facility]   Report Status PENDING   Incomplete  CULTURE, BLOOD (ROUTINE X 2)     Status: None   Collection Time    02/14/13  4:56 PM      Result Value Range Status    Specimen Description BLOOD THUMB LEFT   Final   Special Requests BOTTLES DRAWN AEROBIC ONLY 1CC   Final   Culture  Setup Time     Final   Value: 02/14/2013 22:19     Performed at [facility]   Culture     Final   Value:        BLOOD CULTURE RECEIVED NO GROWTH TO DATE CULTURE WILL BE HELD FOR 5 DAYS BEFORE ISSUING A FINAL NEGATIVE REPORT     Performed at [facility]   Report Status PENDING   Incomplete  MRSA PCR SCREENING     Status: Abnormal   Collection Time    02/15/13  1:21 AM      Result Value Range Status   MRSA by PCR POSITIVE (*) NEGATIVE Final   Comment:            The GeneXpert MRSA Assay (FDA     approved for NASAL specimens     only), is one component of a     comprehensive MRSA colonization     surveillance program. It is not     intended to diagnose MRSA     infection nor to guide or  monitor treatment for     MRSA infections.     RESULT CALLED TO, READ BACK BY AND VERIFIED WITH:     CALLED TO RN [facility] IRISH [id number] @0653  THANEY     Labs: Basic Metabolic Panel:  Recent Labs Lab 02/14/13 1407  NA 141  K 4.4  CL 104  CO2 25  GLUCOSE 85  BUN 18  CREATININE 0.23*  CALCIUM 9.2   Liver Function Tests:  Recent Labs Lab 02/14/13 1407 02/15/13 0500  AST 112* 117*  ALT 99* 95*  ALKPHOS 57 52  BILITOT 0.3 0.2*  PROT 7.3 6.9  ALBUMIN 3.2* 2.9*   No results found for this basename: LIPASE, AMYLASE,  in the last 168 hours  Recent Labs Lab 02/14/13 1650  AMMONIA 47   CBC:  Recent Labs Lab 02/14/13 1407 02/15/13 0500  WBC 5.5 5.6  HGB 14.7 13.2  HCT 43.6 39.0  MCV 93.4 93.5  PLT 177 179   Cardiac Enzymes: No results found for this basename: CKTOTAL, CKMB, CKMBINDEX, TROPONINI,  in the last 168 hours BNP: BNP (last 3 results) No results found for this basename: PROBNP,  in the last 8760 hours CBG: No results found for this basename: GLUCAP,  in the last 168 hours  Additional  labs:    Signed:  Kailan Carmen  [facility] 02/15/2013, 1:08 PM

## 2013-02-15 NOTE — Clinical Social Work Note (Addendum)
Clinical Social Work Department BRIEF PSYCHOSOCIAL ASSESSMENT 02/15/2013  Patient:  KONGMENG, SANTORO     Account Number:  [id number]     Admit date:  02/14/2013  Clinical Social Worker:  Hulan Fray  Date/Time:  02/15/2013 10:34 AM  Referred by:  CSW  Date Referred:  02/15/2013 Referred for  SNF Placement   Other Referral:   Interview type:  Patient Other interview type:    PSYCHOSOCIAL DATA Living Status:  FACILITY Admitted from facility:  [facility] Level of care:  Skilled Nursing Facility Primary support name:  Abbie Sons Primary support relationship to patient:  PARTNER Degree of support available:   supportive    CURRENT CONCERNS Current Concerns  Post-Acute Placement   Other Concerns:    SOCIAL WORK ASSESSMENT / PLAN Clinical Social Worker was informed that patient was admitted from facility. CSW introduced self and explained reason for visit. Patient reported that he is from [facility] and plans to return at discharge. Patient reported that he is long term resident and will be transported via ambulance. Patient reported that he believes he should be discharged today because he just needs a picc line and the facility can manage him further.    CSW will complete FL2 for MD's signature and will facilitate discharge back to facility when medically stable.   Assessment/plan status:  Psychosocial Support/Ongoing Assessment of Needs Other assessment/ plan:   15:04pm CSW called for EMS pickup for 2:15 and reference #- [id number] Information/referral to community resources:   Patient is from facility.    PATIENT'S/FAMILY'S RESPONSE TO PLAN OF CARE: Patient reported that he is long term resident at [facility] and plans to return at discharge. Patient was appreciative of CSW's visit and assistance.

## 2013-02-15 NOTE — Progress Notes (Signed)
Patient discharged to [facility] via ambulance.

## 2013-02-15 NOTE — Progress Notes (Signed)
Pt's BP this am 82/52. Pulse 88. Patient alert and oriented without complaint.  Paged clinician on call.

## 2013-02-15 NOTE — Consult Note (Addendum)
WOC consult Note Reason for Consult: Consult requested for 2 sacral wounds. These are present on admission. Pt appears to be well-informed regarding wounds and topical treatment and is declining assessment at this time.  He states "the nurses that take care of it at the SNF tell me they are looking good and I am leaving."  He does not want to be turned or have dressings changed today.  He agrees that if he does not discharge, then he will allow wound consult to be performed tomorrow.  He is also refusing the air mattress which has been previously ordered to be applied to his bed.  Explained importance of pressure reduction and he agrees that if he is still in the hospital tomorrow, then air mattress can be placed on the bed at that time. Nursing doc flow sheet records: posterior thigh 5X5cm red stage 3,sacrum is 6X5cm and red; no stage is noted.  Cammie Mcgee MSN, RN, CWOCN, [place], CNS [phone]

## 2013-02-16 ENCOUNTER — Other Ambulatory Visit: Payer: Self-pay | Admitting: Geriatric Medicine

## 2013-02-16 LAB — URINE CULTURE: Colony Count: >=100000

## 2013-02-19 ENCOUNTER — Ambulatory Visit (HOSPITAL_COMMUNITY)
Admission: RE | Admit: 2013-02-19 | Discharge: 2013-02-19 | Disposition: A | Discharge status: Home / Self Care | Payer: Federal, State, Local not specified - PPO | Source: Ambulatory Visit | Attending: Internal Medicine | Admitting: Internal Medicine

## 2013-02-19 ENCOUNTER — Other Ambulatory Visit (HOSPITAL_BASED_OUTPATIENT_CLINIC_OR_DEPARTMENT_OTHER): Payer: Self-pay | Admitting: Internal Medicine

## 2013-02-19 ENCOUNTER — Non-Acute Institutional Stay (SKILLED_NURSING_FACILITY): Payer: Federal, State, Local not specified - PPO | Admitting: Internal Medicine

## 2013-02-19 DIAGNOSIS — N10 Acute tubulo-interstitial nephritis: Secondary | ICD-10-CM

## 2013-02-19 DIAGNOSIS — S31000A Unspecified open wound of lower back and pelvis without penetration into retroperitoneum, initial encounter: Secondary | ICD-10-CM

## 2013-02-19 DIAGNOSIS — G8250 Quadriplegia, unspecified: Secondary | ICD-10-CM | POA: Insufficient documentation

## 2013-02-19 DIAGNOSIS — L8990 Pressure ulcer of unspecified site, unspecified stage: Secondary | ICD-10-CM | POA: Insufficient documentation

## 2013-02-19 DIAGNOSIS — R410 Disorientation, unspecified: Secondary | ICD-10-CM

## 2013-02-19 DIAGNOSIS — R404 Transient alteration of awareness: Secondary | ICD-10-CM

## 2013-02-19 DIAGNOSIS — L89109 Pressure ulcer of unspecified part of back, unspecified stage: Secondary | ICD-10-CM | POA: Insufficient documentation

## 2013-02-19 NOTE — Progress Notes (Signed)
Patient ID: Alan Sanford, male   DOB: [birth year], 27 y.o.   MRN: [id number] Facility; [facility] Chief complaint; review of antibiotics for UTI?Marland Kitchen Temporary altered LOC History; the history is a bit difficult to fall. A urine for C&S ordered on the date and the facility. This showed 3 separate organisms including Morganella, Pseudomonas and vancomycin mycin resistant enterococcus. This would suggest a contaminated specimen. It is not quite certain why it was felt a urine culture was needed in the first place. In any case he has been on cefepime 2 g every 12. He was sent to the ER on August 20 when he was found by one of the nurses to be barely responsive eyes upwards towards the ceiling. He was sent to the emergency room and as far as I can tell they felt he had it UTI as well. Urine culture done in the ER showed probably then see a single organism. His blood cultures were negative and I have reviewed them today on epic. He does not have a history of seizures. Patient states he doesn't remember anything until later in the afternoon around 3 PM. Since this time he appears to be back to his normal self both per the patient and the staff  Past medical history is reviewed; this includes his original injury in 2011. Which was the result of an attempted suicide. 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  Physical examination Respiratory clear entry bilaterally Cardiac heart sounds are normal no murmurs he appears to be euvolemic Abdomen normal ostomy site looks stable no liver no spleen no masses no stigmata.  Neurologic no obvious changes here he is awake alert and oriented  Impression/plan Temporary altered level of consciousness which seems to  have responded over the course of roughly 10 hours' I think this is highly unlikely to have represented any form of urinary tract infection. A seizure comes to mind especially in the setting of multiple psychiatric medications some of which probably are lowering his seizure threshold. If anything like this happens again he will probably need an EEG in [facility] imaging. In the meantime I am going to check a comprehensive lab work including a Depakote level  UTI? I think this is likely Providencia which was cultured in the urine culture in the hospital. This was sensitive to Rocephin, as such it should be sensitive to cefepime which he should continue on for 10 days.   He seems to have multiple sites of intravenous access including a right subclavian line which I gather was new when put in at the hospital as well as to peripheral PICC lines. The logic behind any of this really escapes me. The facility tells me that they cannot do any form of phlebotomy on him which may be part of the

## 2013-02-19 NOTE — Procedures (Signed)
Successful RT IJ TUNNELED SL POWER PICC TIP SVC/RA NO COMP STABLE NO ARM VEINS SEEN TO PLACE PERIPHERAL PICC SO RT IJ USED

## 2013-02-20 LAB — CULTURE, BLOOD (ROUTINE X 2)
Culture: NO GROWTH
Culture: NO GROWTH

## 2013-02-22 ENCOUNTER — Other Ambulatory Visit: Payer: Self-pay | Admitting: *Deleted

## 2013-02-22 MED ORDER — OXYCODONE HCL 10 MG PO TABS: ORAL_TABLET | ORAL | Status: DC

## 2013-02-25 ENCOUNTER — Non-Acute Institutional Stay (SKILLED_NURSING_FACILITY): Payer: Federal, State, Local not specified - PPO | Admitting: Internal Medicine

## 2013-02-25 DIAGNOSIS — D751 Secondary polycythemia: Secondary | ICD-10-CM

## 2013-02-25 NOTE — Progress Notes (Signed)
Patient ID: Alan Sanford, male   DOB: 1985-07-26, 27 y.o.   MRN: [id number] Facility; [facility] Chief complaint, polycythemia His; it was handed lab work this morning a Mr. Alan Sanford who which I think was initially supposed to be a followup of his labs from her recent transient LOC/pyelonephritis. This surprisingly showed a white count of 3.1 a hemoglobin of 21.1 and hematocrit of 16.6%. Lab work from 2 weeks ago showed a white count of 5.9 and hemoglobin of 13.6 furthermore on July 2 his white count was 9.5 and hemoglobin of 13.8. I don't believe that there is a possible cause of this to occur in 2-1/2 weeks nevertheless I felt that necessary to review this.  Review of systems; Respiratory no cough no sputum Cardiac no chest pain GI no nausea vomiting or diarrhea  Physical exam O2 sat 96% on room air pulse 77 respirations 16 with no distress Respiratory shallow air entry but no crackles or wheezes his work of breathing appears to be normal Cardiac heart sounds are normal no murmurs Abdomen no liver no spleen no tenderness Extremities no edema no evidence of a DVT  Impression/plan #1 polycythemia; I suspect this is a lab issue and I'm going to repeat this. Chronic hypoxemia and a man who is a high Alan Sanford injury certainly comes to mind I am doubtful that this could occur in this time frame furthermore his O2 sat seems to be really very reasonable on room air

## 2013-02-27 ENCOUNTER — Other Ambulatory Visit: Payer: Self-pay | Admitting: *Deleted

## 2013-02-27 MED ORDER — TEMAZEPAM 22.5 MG PO CAPS: 22.5000 mg | ORAL_CAPSULE | Freq: Every evening | ORAL | Status: AC | PRN

## 2013-03-07 ENCOUNTER — Non-Acute Institutional Stay (SKILLED_NURSING_FACILITY): Payer: Federal, State, Local not specified - PPO | Admitting: Internal Medicine

## 2013-03-07 DIAGNOSIS — F319 Bipolar disorder, unspecified: Secondary | ICD-10-CM

## 2013-03-07 DIAGNOSIS — G8250 Quadriplegia, unspecified: Secondary | ICD-10-CM

## 2013-03-07 DIAGNOSIS — D649 Anemia, unspecified: Secondary | ICD-10-CM

## 2013-03-07 DIAGNOSIS — Q179 Congenital malformation of ear, unspecified: Secondary | ICD-10-CM

## 2013-03-07 DIAGNOSIS — R7989 Other specified abnormal findings of blood chemistry: Secondary | ICD-10-CM

## 2013-03-07 DIAGNOSIS — R609 Edema, unspecified: Secondary | ICD-10-CM

## 2013-03-07 DIAGNOSIS — I2699 Other pulmonary embolism without acute cor pulmonale: Secondary | ICD-10-CM

## 2013-03-07 NOTE — Progress Notes (Signed)
Patient ID: Alan Sanford, male   DOB: [birth year], 27 y.o.   MRN: [id number] This is an acute/routine visit.  [facility]ility [facility].  Level of care skilled  .  Chief complaint-. Medical management of quadriplegia-anemia-neuropathy-history of depression with bipolar disorder-acute visit followup UTI  History of present illness.  Patient is a pleasant 27 year old male with a history of C4 quadriplegia since 2011.  Apparently this was the result of an attempted suicide.  He has been a functioning C4 quadriplegic since that.   He has had a history of recurrent UTIs but at this point appears to be relatively stable he does have an indwelling Foley catheter    He does have a history of osteomyelitis with a history o fcoccyx and right tibial wound status this is followed by wound care and apparently these are improving.     He does have a history of significant depression and associate bipolar disorder.  He is on numerous agents including Prozac Depakote Valium and Seroquel as well as Wellbutrin.  Apparently at one point it was felt the Depakote was leading to mental status changes the Depakote was reduced this has been titrated up somewhat secondary to apparently increased behaviors and this appears to have stabilized.  Patient also has a history of anemia hemoglobin back in April was over 15 however subsequent level done in May showed a drop into the 11-12 range updated labs show normalization to about 13 however her lab done on August 29 showed a hemoglobin of 21.6 which was rather bizarre Dr. Leanord Hawking did assess this this was thought to be possibly a lab variation in updated lab was ordered I do not see those results yet   he is on iron  We did order guaiac testing apparently these have been negative x3.  Today he denies any fever chills appears to be at his baseline.--He is complaining of some left ear pain he says this has gone on for about a week is not any worse or better than it was a week ago   He says it is the somewhat sudden throbbing pain that lasts for a few seconds and then disappears-it occurs about -4 times a day-he does not complaining of any jaw pain or difficulty swallowing or impaired hearing   he also had some increased edema-a few weeks ago we did start him on Lasix apparently had been on this before-his weight  has stabilized now back to his baseline      Family medical social history has been reviewed per history and physical on 10/31/2012.  Medications have been reviewed per MAR  .  Review of systems.  In general denies fever or chills.  Head ears eyes nose mouth and throat-does not complaining of any visual changes nasal discharge i complaining of some left ear discomfort noted above Respiratory no complaints of shortness of breath or cough.  Cardiac-does not complaining of chest pain.  GI he does have a colostomy bag this has been functioning well denies any abdominal pain nausea or vomiting diarrhea or constipation  GU-as stated in history of present illness.  Muscle skeletal does have history of quadriplegia this is stable he does not complain of pain .  Neurologic-as stated above does not complain of numbness does continue on Neurontin.  Psych-as stated above he appears to be doing well and is stable  .  Physical exam.  Temperature 98.1 pulse 72 respirations 18 blood pressure 116/68 O2 saturation is 96% on room air  l this  is a pleasant young male in no distress lying in bed he appears comfortable.  The skin is warm and dry ---- does have a history ofischialwound this is followed by Wound Care and Dr. Leanord Hawking-  oropharynx clear mucous membranes moist.  Eyes pupils appear reactive to light extraocular movements intact visual acuity appears grossly intac Ears--- -Does have a small amount of wax bilaterally-tympanic membrane is visualized  More so on the left... there is no erythema or exudate noted blaterally  no erythema is noted either .  Chest is clear  to auscultation without rhonchi rales or wheezes.  Heart is regular rate and rhythm without murmur gallop or rub.--He does have some mild edema bilaterally--this is relatively baseline with previous exams  d  Abdomen is soft nontender with active bowel sounds colostomy bag appears unremarkable with a moderate amount of stool in it.  GU he does have a Foley catheter draining amber colored urine.  Neurologic again does have deficits secondary to C4 quadriplegia.  His speech is clear-he is alert and oriented x3 pleasant and appropriate.  Psych-as stated above is pleasant and appropriate alert and oriented x3-  r  Labs   02/23/2013.  UBC 3.1 hemoglobin 21.1 platelets 48.  Sodium 141 potassium 3.9 BUN 9 creatinine 0.3-liver function tests within normal limits except AST of 82 and ALT of 85.  Depakote level was 59  12/27/2012.  WBC 9.5 hemoglobin 13.8 platelets 270.  Sodium 145 potassium 3.8 BUN 9 creatinine 0.36.  Liver function tests within normal limits.  Valproic acid level XLIX.  [id number].  Folate-15.6-  .  12/17/2012.  Urine culture as noted above.  12/18/2012.  Chest x-ray did not show any acute process.  12/18/2012.  Sodium 134 potassium 3.9 BUN 6 creatinine 0.32.  Liver function tests within normal limits except albumin of 2.7 ALT of 73 AST of 82.  CBC 9.6 hemoglobin 11.6 platelets 177.  Marland Kitchen  11/13/2012.  WBC 9.7 hemoglobin 11.8 platelets 318.  11/07/2012.  WBC 3.8 hemoglobin 11.5 platelets 250.  BUN 10 creatinine 0.36 sodium 140 potassium 4.1.  10/25/2012.  Hemoglobin was 15.3.  Liver function tests were within normal limits.  Valproic acid level was    Assessment and plan Ear pain-- physical exam was fairly benign I did not appreciate any adenopathy in the area either continue to monitorf   2-history of quadriplegia-patient appears to be doing well and stable in this environment-he is on numerous agents including baclofen ibuprofen as well as Neurontin and  oxybutynin  . 3-anemia -- somewhat of bizarre recent reading of a hemoglobin of over 21-Dr. Leanord Hawking did order update labs I do not see the results --if not drawn will order followup   #78mildly elevated liver function tests-will updateb  #5 history depression with bipolar disorder-this appears stable on current medications appears to have benefited from the Depakote increase after initial decrease as his mental status and behaviore apparently returned to baseline he was pleasant and appropriate during exam--to note his Prozac was recently increased by psychiatry #6 status edema-this appears to have stabilized he is now on  Lasix with potassium Will update metabolic panel   #8-IONGEXB of embolism-he continues on  Pradaxa has tolerated this well  #8-neuropathy-he continues on Neurontin apparently with good effect.  #9 as high-risk meds-Hbg A1c last month was within normal range at 5.3 [id number]

## 2013-04-05 ENCOUNTER — Other Ambulatory Visit: Payer: Self-pay | Admitting: *Deleted

## 2013-04-05 MED ORDER — OXYCODONE HCL 10 MG PO TABS: ORAL_TABLET | ORAL | Status: DC

## 2013-05-16 ENCOUNTER — Non-Acute Institutional Stay (SKILLED_NURSING_FACILITY): Payer: Federal, State, Local not specified - PPO | Admitting: Internal Medicine

## 2013-05-16 DIAGNOSIS — F319 Bipolar disorder, unspecified: Secondary | ICD-10-CM

## 2013-05-16 DIAGNOSIS — N390 Urinary tract infection, site not specified: Secondary | ICD-10-CM

## 2013-05-30 ENCOUNTER — Non-Acute Institutional Stay (SKILLED_NURSING_FACILITY): Payer: Federal, State, Local not specified - PPO | Admitting: Internal Medicine

## 2013-05-30 DIAGNOSIS — R0902 Hypoxemia: Secondary | ICD-10-CM

## 2013-05-30 NOTE — Progress Notes (Signed)
Patient ID: Alan Sanford, male   DOB: 1986-03-17, 27 y.o.   MRN: [id number]           PROGRESS NOTE  DATE:  05/16/2013  FACILITY: [facility]    LEVEL OF CARE:   SNF   Acute Visit   CHIEF COMPLAINT:  Review of medical issues including recent urine culture.    HISTORY OF PRESENT ILLNESS:  This is a 27 year-old man who is a C4 quadriplegic since 2011.  He has a chronic Foley catheter and colostomy.    He has had a series of questionable UTIs.  The urine culture done earlier this month showed four different organisms including VRE, Pseudomonas, Proteus morganella.  The patient was not symptomatic.  Therefore, no attempt to treat this was initiated.  I believe we did change the Foley catheter.    The patient has bipolar affective disorder and was seen, I believe, by his own outside psychiatrist at [facility] and started on Abilify.    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  We elected to keep him on longstanding Pradaxa even though this is off-label.  (Please see my previous notes on this.)     PHYSICAL EXAMINATION:   GENERAL APPEARANCE:  The patient does not look to be in any distress.   CHEST/RESPIRATORY:  Clear air entry bilaterally.   CARDIOVASCULAR:  CARDIAC:   Heart sounds are normal.  There are no murmurs.   GASTROINTESTINAL:  ABDOMEN:   Somewhat distended.  There are no masses and no tenderness.   CIRCULATION:   EDEMA/VARICOSITIES:  Extremities:  He has some degree of edema in his left thigh greater than the right.  This is not warm.  It is not associated with more distal edema.  I would like to get a better look at this next week when he is undressed.    LABORATORY DATA:  Recent lab work over the last several weeks has been normal including a normal CBC, BMP, and a BNP of 6.6.    ASSESSMENT/PLAN:  Catheter-associated UTI.  I think he did receive a course of cefepime after all of this,  although I do not see these orders.  In general, I would like to avoid treating these catheter-related issues.  Unless there is concrete evidence of infection, we are running the risk of increasingly resistant organisms.    History of a DVT with PE.  He has an allergy to Xarelto and I believe there was some problem with Coumadin.  Therefore, we have been continuing him on Pradaxa.  I have discussed this extensively with his POA.    Bipolar disorder.  Recently added Abilify 5 mg a day through his outside facility psychiatrist.    CPT CODE: 11914

## 2013-05-31 ENCOUNTER — Emergency Department (HOSPITAL_COMMUNITY): Payer: Federal, State, Local not specified - PPO

## 2013-05-31 ENCOUNTER — Encounter (HOSPITAL_COMMUNITY): Payer: Self-pay | Admitting: Emergency Medicine

## 2013-05-31 ENCOUNTER — Inpatient Hospital Stay (HOSPITAL_COMMUNITY)
Admission: EM | Admit: 2013-05-31 | Discharge: 2013-06-28 | DRG: 871 | Disposition: E | Payer: Federal, State, Local not specified - PPO | Attending: Pulmonary Disease | Admitting: Pulmonary Disease

## 2013-05-31 ENCOUNTER — Inpatient Hospital Stay (HOSPITAL_COMMUNITY): Payer: Federal, State, Local not specified - PPO

## 2013-05-31 DIAGNOSIS — T17408A Unspecified foreign body in trachea causing other injury, initial encounter: Secondary | ICD-10-CM | POA: Diagnosis present

## 2013-05-31 DIAGNOSIS — E46 Unspecified protein-calorie malnutrition: Secondary | ICD-10-CM | POA: Diagnosis present

## 2013-05-31 DIAGNOSIS — L8990 Pressure ulcer of unspecified site, unspecified stage: Secondary | ICD-10-CM | POA: Diagnosis present

## 2013-05-31 DIAGNOSIS — R4182 Altered mental status, unspecified: Secondary | ICD-10-CM

## 2013-05-31 DIAGNOSIS — X838XXS Intentional self-harm by other specified means, sequela: Secondary | ICD-10-CM

## 2013-05-31 DIAGNOSIS — E876 Hypokalemia: Secondary | ICD-10-CM | POA: Diagnosis present

## 2013-05-31 DIAGNOSIS — J690 Pneumonitis due to inhalation of food and vomit: Secondary | ICD-10-CM | POA: Diagnosis present

## 2013-05-31 DIAGNOSIS — J15212 Pneumonia due to Methicillin resistant Staphylococcus aureus: Secondary | ICD-10-CM | POA: Diagnosis present

## 2013-05-31 DIAGNOSIS — G8250 Quadriplegia, unspecified: Secondary | ICD-10-CM

## 2013-05-31 DIAGNOSIS — R1310 Dysphagia, unspecified: Secondary | ICD-10-CM | POA: Diagnosis present

## 2013-05-31 DIAGNOSIS — A419 Sepsis, unspecified organism: Secondary | ICD-10-CM

## 2013-05-31 DIAGNOSIS — F319 Bipolar disorder, unspecified: Secondary | ICD-10-CM

## 2013-05-31 DIAGNOSIS — T17308A Unspecified foreign body in larynx causing other injury, initial encounter: Secondary | ICD-10-CM | POA: Diagnosis present

## 2013-05-31 DIAGNOSIS — J9620 Acute and chronic respiratory failure, unspecified whether with hypoxia or hypercapnia: Secondary | ICD-10-CM

## 2013-05-31 DIAGNOSIS — J9819 Other pulmonary collapse: Secondary | ICD-10-CM

## 2013-05-31 DIAGNOSIS — IMO0002 Reserved for concepts with insufficient information to code with codable children: Secondary | ICD-10-CM | POA: Diagnosis present

## 2013-05-31 DIAGNOSIS — J988 Other specified respiratory disorders: Secondary | ICD-10-CM

## 2013-05-31 DIAGNOSIS — R630 Anorexia: Secondary | ICD-10-CM | POA: Diagnosis present

## 2013-05-31 DIAGNOSIS — G9340 Encephalopathy, unspecified: Secondary | ICD-10-CM | POA: Diagnosis present

## 2013-05-31 DIAGNOSIS — D649 Anemia, unspecified: Secondary | ICD-10-CM

## 2013-05-31 DIAGNOSIS — E669 Obesity, unspecified: Secondary | ICD-10-CM | POA: Diagnosis present

## 2013-05-31 DIAGNOSIS — E872 Acidosis, unspecified: Secondary | ICD-10-CM | POA: Diagnosis present

## 2013-05-31 DIAGNOSIS — L89309 Pressure ulcer of unspecified buttock, unspecified stage: Secondary | ICD-10-CM | POA: Diagnosis present

## 2013-05-31 DIAGNOSIS — Z933 Colostomy status: Secondary | ICD-10-CM

## 2013-05-31 DIAGNOSIS — E870 Hyperosmolality and hypernatremia: Secondary | ICD-10-CM | POA: Diagnosis not present

## 2013-05-31 DIAGNOSIS — J9600 Acute respiratory failure, unspecified whether with hypoxia or hypercapnia: Secondary | ICD-10-CM

## 2013-05-31 DIAGNOSIS — Z7901 Long term (current) use of anticoagulants: Secondary | ICD-10-CM

## 2013-05-31 DIAGNOSIS — I9589 Other hypotension: Secondary | ICD-10-CM | POA: Diagnosis present

## 2013-05-31 DIAGNOSIS — N390 Urinary tract infection, site not specified: Secondary | ICD-10-CM | POA: Diagnosis present

## 2013-05-31 DIAGNOSIS — Z8701 Personal history of pneumonia (recurrent): Secondary | ICD-10-CM

## 2013-05-31 DIAGNOSIS — Z66 Do not resuscitate: Secondary | ICD-10-CM | POA: Diagnosis present

## 2013-05-31 DIAGNOSIS — R627 Adult failure to thrive: Secondary | ICD-10-CM | POA: Diagnosis present

## 2013-05-31 DIAGNOSIS — R609 Edema, unspecified: Secondary | ICD-10-CM

## 2013-05-31 DIAGNOSIS — I2699 Other pulmonary embolism without acute cor pulmonale: Secondary | ICD-10-CM

## 2013-05-31 DIAGNOSIS — R0902 Hypoxemia: Secondary | ICD-10-CM

## 2013-05-31 DIAGNOSIS — Z515 Encounter for palliative care: Secondary | ICD-10-CM

## 2013-05-31 LAB — URINALYSIS, ROUTINE W REFLEX MICROSCOPIC
Ketones, ur: 15 mg/dL — AB
Nitrite: POSITIVE — AB
Specific Gravity, Urine: 1.028 (ref 1.005–1.030)
pH: 7 (ref 5.0–8.0)

## 2013-05-31 LAB — POCT I-STAT 3, ART BLOOD GAS (G3+)
TCO2: 28 mmol/L (ref 0–100)
pH, Arterial: 7.31 — ABNORMAL LOW (ref 7.350–7.450)

## 2013-05-31 LAB — CBC WITH DIFFERENTIAL/PLATELET
Eosinophils Absolute: 0.1 10*3/uL (ref 0.0–0.7)
Eosinophils Relative: 0 % (ref 0–5)
Hemoglobin: 14.5 g/dL (ref 13.0–17.0)
Lymphs Abs: 3.1 10*3/uL (ref 0.7–4.0)
MCH: 33 pg (ref 26.0–34.0)
MCV: 99.3 fL (ref 78.0–100.0)
Monocytes Relative: 15 % — ABNORMAL HIGH (ref 3–12)
RBC: 4.39 MIL/uL (ref 4.22–5.81)

## 2013-05-31 LAB — COMPREHENSIVE METABOLIC PANEL
BUN: 12 mg/dL (ref 6–23)
Calcium: 9.1 mg/dL (ref 8.4–10.5)
Creatinine, Ser: 0.29 mg/dL — ABNORMAL LOW (ref 0.50–1.35)
GFR calc Af Amer: >90 mL/min (ref >90)
Glucose, Bld: 98 mg/dL (ref 70–99)
Total Protein: 7.7 g/dL (ref 6.0–8.3)

## 2013-05-31 LAB — PRO B NATRIURETIC PEPTIDE: Pro B Natriuretic peptide (BNP): 68.1 pg/mL (ref 0–125)

## 2013-05-31 LAB — GLUCOSE, CAPILLARY

## 2013-05-31 LAB — PROTIME-INR: INR: 1.1 (ref 0.00–1.49)

## 2013-05-31 LAB — URINE MICROSCOPIC-ADD ON

## 2013-05-31 LAB — CG4 I-STAT (LACTIC ACID): Lactic Acid, Venous: 1.55 mmol/L (ref 0.5–2.2)

## 2013-05-31 MED ORDER — VANCOMYCIN HCL IN DEXTROSE 1-5 GM/200ML-% IV SOLN
1000.0000 mg | Freq: Once | INTRAVENOUS | Status: DC
  Filled 2013-05-31: qty 200

## 2013-05-31 MED ORDER — PROPOFOL 10 MG/ML IV EMUL
5.0000 ug/kg/min | INTRAVENOUS | Status: DC
  Administered 2013-05-31: 35 ug/kg/min via INTRAVENOUS
  Administered 2013-06-01: 20 ug/kg/min via INTRAVENOUS
  Filled 2013-05-31 (×3): qty 100

## 2013-05-31 MED ORDER — ROCURONIUM BROMIDE 50 MG/5ML IV SOLN
INTRAVENOUS | Status: DC | PRN
  Administered 2013-05-31: 50 mg via INTRAVENOUS

## 2013-05-31 MED ORDER — DEXTROSE 5 % IV SOLN
1.0000 g | Freq: Three times a day (TID) | INTRAVENOUS | Status: DC
  Administered 2013-05-31 – 2013-06-05 (×17): 1 g via INTRAVENOUS
  Filled 2013-05-31 (×21): qty 1

## 2013-05-31 MED ORDER — DEXTROSE 5 % IV SOLN
2.0000 g | Freq: Once | INTRAVENOUS | Status: AC
  Administered 2013-05-31: 2 g via INTRAVENOUS
  Filled 2013-05-31: qty 2

## 2013-05-31 MED ORDER — DIVALPROEX SODIUM 500 MG PO DR TAB: 500.0000 mg | DELAYED_RELEASE_TABLET | Freq: Two times a day (BID) | ORAL | Status: DC

## 2013-05-31 MED ORDER — PROPOFOL 10 MG/ML IV EMUL
5.0000 ug/kg/min | INTRAVENOUS | Status: DC
  Administered 2013-05-31: 20 ug/kg/min via INTRAVENOUS

## 2013-05-31 MED ORDER — FENTANYL CITRATE 0.05 MG/ML IJ SOLN: 25.0000 ug | INTRAMUSCULAR | Status: DC | PRN

## 2013-05-31 MED ORDER — SODIUM CHLORIDE 0.9 % IV SOLN
1000.0000 mL | Freq: Once | INTRAVENOUS | Status: AC
  Administered 2013-05-31: 1000 mL via INTRAVENOUS

## 2013-05-31 MED ORDER — ETOMIDATE 2 MG/ML IV SOLN
INTRAVENOUS | Status: DC | PRN
  Administered 2013-05-31: 20 mg via INTRAVENOUS

## 2013-05-31 MED ORDER — MIDAZOLAM HCL 2 MG/2ML IJ SOLN
2.0000 mg | Freq: Once | INTRAMUSCULAR | Status: AC
  Administered 2013-05-31: 2 mg via INTRAVENOUS

## 2013-05-31 MED ORDER — MIDAZOLAM HCL 2 MG/2ML IJ SOLN
INTRAMUSCULAR | Status: AC
  Filled 2013-05-31: qty 2

## 2013-05-31 MED ORDER — IOHEXOL 350 MG/ML SOLN
100.0000 mL | Freq: Once | INTRAVENOUS | Status: AC | PRN
  Administered 2013-05-31: 100 mL via INTRAVENOUS

## 2013-05-31 MED ORDER — ENOXAPARIN SODIUM 40 MG/0.4ML ~~LOC~~ SOLN
40.0000 mg | SUBCUTANEOUS | Status: DC
  Administered 2013-05-31: 40 mg via SUBCUTANEOUS
  Filled 2013-05-31 (×3): qty 0.4

## 2013-05-31 MED ORDER — ROCURONIUM BROMIDE 50 MG/5ML IV SOLN
50.0000 mg | Freq: Once | INTRAVENOUS | Status: DC
  Filled 2013-05-31: qty 5

## 2013-05-31 MED ORDER — PROPOFOL 10 MG/ML IV EMUL
INTRAVENOUS | Status: AC
  Filled 2013-05-31: qty 100

## 2013-05-31 MED ORDER — QUETIAPINE FUMARATE 400 MG PO TABS
400.0000 mg | ORAL_TABLET | Freq: Every day | ORAL | Status: DC
  Administered 2013-06-01 – 2013-06-13 (×13): 400 mg via ORAL
  Filled 2013-05-31 (×15): qty 1

## 2013-05-31 MED ORDER — DIVALPROEX SODIUM 250 MG PO DR TAB: 250.0000 mg | DELAYED_RELEASE_TABLET | Freq: Two times a day (BID) | ORAL | Status: DC

## 2013-05-31 MED ORDER — LINEZOLID 2 MG/ML IV SOLN
600.0000 mg | INTRAVENOUS | Status: AC
  Administered 2013-05-31: 600 mg via INTRAVENOUS
  Filled 2013-05-31: qty 300

## 2013-05-31 MED ORDER — SODIUM CHLORIDE 0.9 % IV SOLN
1000.0000 mL | INTRAVENOUS | Status: DC
  Administered 2013-05-31: 1000 mL via INTRAVENOUS

## 2013-05-31 MED ORDER — FENTANYL CITRATE 0.05 MG/ML IJ SOLN
50.0000 ug | Freq: Once | INTRAMUSCULAR | Status: AC
  Administered 2013-05-31: 50 ug via INTRAVENOUS

## 2013-05-31 MED ORDER — LINEZOLID 2 MG/ML IV SOLN
600.0000 mg | Freq: Two times a day (BID) | INTRAVENOUS | Status: DC
  Administered 2013-05-31 – 2013-06-03 (×6): 600 mg via INTRAVENOUS
  Filled 2013-05-31 (×7): qty 300

## 2013-05-31 MED ORDER — ETOMIDATE 2 MG/ML IV SOLN: 20.0000 mg | Freq: Once | INTRAVENOUS | Status: DC

## 2013-05-31 MED ORDER — MIDAZOLAM HCL 2 MG/2ML IJ SOLN: 2.0000 mg | INTRAMUSCULAR | Status: DC | PRN

## 2013-05-31 MED ORDER — ARIPIPRAZOLE 10 MG PO TABS
10.0000 mg | ORAL_TABLET | Freq: Every day | ORAL | Status: DC
  Administered 2013-05-31 – 2013-06-14 (×15): 10 mg via ORAL
  Filled 2013-05-31 (×16): qty 1

## 2013-05-31 MED ORDER — VALPROIC ACID 250 MG/5ML PO SYRP
750.0000 mg | ORAL_SOLUTION | Freq: Two times a day (BID) | ORAL | Status: DC
  Administered 2013-05-31 – 2013-06-04 (×8): 750 mg
  Filled 2013-05-31 (×9): qty 15

## 2013-05-31 MED ORDER — BENZTROPINE MESYLATE 0.5 MG PO TABS
0.5000 mg | ORAL_TABLET | Freq: Two times a day (BID) | ORAL | Status: DC
  Administered 2013-05-31 – 2013-06-14 (×29): 0.5 mg via ORAL
  Filled 2013-05-31 (×32): qty 1

## 2013-05-31 MED ORDER — KCL IN DEXTROSE-NACL 20-5-0.45 MEQ/L-%-% IV SOLN
INTRAVENOUS | Status: DC
  Administered 2013-05-31 – 2013-06-01 (×2): via INTRAVENOUS
  Filled 2013-05-31 (×3): qty 1000

## 2013-05-31 MED ORDER — FENTANYL CITRATE 0.05 MG/ML IJ SOLN
INTRAMUSCULAR | Status: AC
  Filled 2013-05-31: qty 2

## 2013-05-31 MED ORDER — FLUOXETINE HCL 20 MG PO CAPS
40.0000 mg | ORAL_CAPSULE | Freq: Every day | ORAL | Status: DC
  Administered 2013-05-31: 40 mg via ORAL
  Filled 2013-05-31 (×3): qty 2

## 2013-05-31 MED ORDER — SODIUM CHLORIDE 0.9 % IV SOLN
20.0000 ug/h | INTRAVENOUS | Status: DC
  Administered 2013-05-31: 50 ug/h via INTRAVENOUS
  Filled 2013-05-31: qty 50

## 2013-05-31 MED ORDER — PANTOPRAZOLE SODIUM 40 MG IV SOLR
40.0000 mg | INTRAVENOUS | Status: DC
  Administered 2013-05-31: 40 mg via INTRAVENOUS
  Filled 2013-05-31: qty 40

## 2013-05-31 MED ORDER — CLONAZEPAM 1 MG PO TABS
2.0000 mg | ORAL_TABLET | Freq: Two times a day (BID) | ORAL | Status: DC
  Administered 2013-05-31 – 2013-06-14 (×28): 2 mg via ORAL
  Filled 2013-05-31 (×12): qty 4
  Filled 2013-05-31 (×2): qty 2
  Filled 2013-05-31 (×3): qty 4
  Filled 2013-05-31: qty 2
  Filled 2013-05-31: qty 4
  Filled 2013-05-31: qty 2
  Filled 2013-05-31: qty 4
  Filled 2013-05-31: qty 2
  Filled 2013-05-31 (×8): qty 4

## 2013-05-31 NOTE — ED Notes (Signed)
Patient quadriplegic and began having sob starting yesterday and increasing today, patient placed on cpap per ems, patient placed on bi-pap once arriving in ED

## 2013-05-31 NOTE — ED Provider Notes (Signed)
I saw and evaluated the patient, reviewed the resident's note and I agree with the findings and plan. If applicable, I agree with the resident's interpretation of the EKG.  If applicable, I was present for critical portions of any procedures performed.  C4 quadraplegic with SOB and cough x 12 hours. Hypoxic to 70s CPAP started by EMS. Denies chest pain. Respiratory distress with hypoxia on EMS cpap, o2 saturations 70s.  Switched to ED bipap with improvement in saturation and mental status but still dyspneic. CXR with subtle infiltrate, seems disproportionate with distress. Consider PE, patient with hx of same, on pradaxa. Intubation discussed with patient.  He adamantly refuses.  He has had previous tracheostomies. He is oriented x3.  He is able to repeat back to me that if he is not intubated, he may die. It was explained to the patient that he may go unconscious and stop breathing without intubation but continued to refuse. He appears to have capacity to make this decision.   This discussion was witnessed by myself, Dr. Piedad Climes, Dr. Brandy Hale RT, and RN staff.  All agree patient appears competent and understands he may die. bipap and antibiotics continued. Patient's multiple POAs unable to convince him either. At this point he appears able to make his own medical decisions. CT shows no PE, but complete R mainstem occlusion by mucus plug. D/w PCCM again.  Dr. Sung Amabile to see patient and was able to get patient to agree to temporary intubation for bronchoscopy.  CRITICAL CARE Performed by: Glynn Octave Total critical care time: 45 Critical care time was exclusive of separately billable procedures and treating other patients. Critical care was necessary to treat or prevent imminent or life-threatening deterioration. Critical care was time spent personally by me on the following activities: development of treatment plan with patient and/or surrogate as well as nursing, discussions with consultants,  evaluation of patient's response to treatment, examination of patient, obtaining history from patient or surrogate, ordering and performing treatments and interventions, ordering and review of laboratory studies, ordering and review of radiographic studies, pulse oximetry and re-evaluation of patient's condition.   Glynn Octave, MD 06/19/2013 [phone]

## 2013-05-31 NOTE — Procedures (Signed)
Indication:   R lung collapse due to mucus plugging  Premedication:  Propofol infusion, fentanyl infusion, fentanyl 50 mcg bolus    Procedure: After adequate sedation and anesthesia, the bronchoscope was introduced via the ETT and a cursory airway exam was performed. This revealed normal airway anatomy. There were diffuse changes of mild acute bronchitis. There were copious purulent secretions throughout the R bronchial system. These were successfully suctioned with 3 passes of the bronchoscope. Approx 60 cc of NS were used during the procedure. A specimen was sent for GC, cx. The pt tolerated the procedure without complications.     Alan Fischer, MD;  PCCM service; Mobile [phone]

## 2013-05-31 NOTE — H&P (Signed)
PULMONARY  / CRITICAL CARE MEDICINE  Name: Alan Sanford MRN: [id number] DOB: [birth year]    ADMISSION DATE:  05/30/2013 CONSULTATION DATE:  06/03/2013  REFERRING MD :  EDP  CHIEF COMPLAINT:  Resp failure   HPI -  27yo male SNF resident C4 quadriplegic since 2011 with chronic indwelling foley presented 12/4 with worsening SOB x 1 day requiring bipap.  Initially hypoxic with sats 70's, slightly improved on bipap but remains dyspneic.  Per SNF has had cough x 2 days with increased SOB.  Found to have subtle PNA on CXR with mild resp acidosis and PCCM consulted.  Denies chest pain, hemoptysis, syncope, headache, n/v/d, abd pain.      PAST MEDICAL HISTORY :  Past Medical History  Diagnosis Date  . Quadriplegia, C1-C4 complete   . Bipolar 1 disorder   . Respiratory failure, chronic   . GERD (gastroesophageal reflux disease)   . Pressure ulcer   . Anxiety   . Depression    Past Surgical History  Procedure Laterality Date  . Colostomy     Prior to Admission medications   Medication Sig Start Date End Date Taking? Authorizing Provider  baclofen (LIORESAL) 20 MG tablet Take 30 mg by mouth 3 (three) times daily. Take one and one-half tablet by mouth three times daily.    Historical Provider, MD  dabigatran (PRADAXA) 150 MG CAPS capsule Take 150 mg by mouth daily.     Historical Provider, MD  diazepam (VALIUM) 10 MG tablet Take 10 mg by mouth every 8 (eight) hours as needed for anxiety. 11/10/12   Tiffany L Reed, DO  divalproex (DEPAKOTE) 250 MG DR tablet Take 250 mg by mouth 2 (two) times daily. 250 mg in addition to 500 mg to equal 750 mg twice daily    Historical Provider, MD  divalproex (DEPAKOTE) 500 MG DR tablet Take 500 mg by mouth 2 (two) times daily. Take 500 mg in addition to 250 mg to equal 750 mg twice daily    Historical Provider, MD  ergocalciferol (VITAMIN D2) 50000 UNITS capsule Take 50,000 Units by mouth every Monday, Wednesday, and Friday.    Historical Provider, MD  ferrous  sulfate 325 (65 FE) MG tablet Take 325 mg by mouth 2 (two) times daily.     Historical Provider, MD  FLUoxetine (PROZAC) 20 MG capsule Take 20 mg by mouth daily. 20 mg daily in addition to 40 mg to equal 60 mg daily    Historical Provider, MD  FLUoxetine (PROZAC) 40 MG capsule Take 40 mg by mouth daily. 40 mg in addition to 20 mg to equal 60 mg daily    Historical Provider, MD  gabapentin (NEURONTIN) 800 MG tablet Take 800 mg by mouth 3 (three) times daily.    Historical Provider, MD  ibuprofen (ADVIL,MOTRIN) 800 MG tablet Take 800 mg by mouth every 6 (six) hours as needed for fever.    Historical Provider, MD  loratadine (CLARITIN) 10 MG tablet Take 10 mg by mouth daily.    Historical Provider, MD  magnesium oxide (MAG-OX) 400 MG tablet Take 400 mg by mouth daily.    Historical Provider, MD  Multiple Vitamin (MULTIVITAMIN WITH MINERALS) TABS tablet Take 1 tablet by mouth daily.    Historical Provider, MD  oxybutynin (DITROPAN-XL) 5 MG 24 hr tablet Take 5 mg by mouth 2 (two) times daily.    Historical Provider, MD  Oxycodone HCl 10 MG TABS Take one tablet by mouth every 4 hours as needed for  moderate to severe pain. 04/05/13   Claudie Revering, NP  QUEtiapine (SEROQUEL) 400 MG tablet Take 400 mg by mouth at bedtime.    Historical Provider, MD  temazepam (RESTORIL) 22.5 MG capsule Take 1 capsule (22.5 mg total) by mouth at bedtime as needed for sleep. Take one capsule by mouth every night at bedtime for sleep 02/27/13   Claudie Revering, NP  vitamin C (ASCORBIC ACID) 500 MG tablet Take 500 mg by mouth 2 (two) times daily.    Historical Provider, MD  Vitamin D, Ergocalciferol, (DRISDOL) 50000 UNITS CAPS capsule Take 50,000 Units by mouth.    Historical Provider, MD   Allergies  Allergen Reactions  . Sulfa Antibiotics Anaphylaxis    Unknown   . Cefepime Other (See Comments)    Itching (pt states he can take)  . Xarelto [Rivaroxaban] Other (See Comments)    Acute Mental Status changes    FAMILY  HISTORY:  No family history on file. SOCIAL HISTORY:  reports that he has never smoked. He has never used smokeless tobacco. He reports that he does not drink alcohol or use illicit drugs.  REVIEW OF SYSTEMS:   As per HPI, all other systems reviewed and were neg.    VITAL SIGNS: Pulse Rate:  [82-108] 101 (12/04 1630) Resp:  [6-33] 20 (12/04 1630) BP: (87-136)/(52-97) 128/87 mmHg (12/04 1630) SpO2:  [89 %-100 %] 99 % (12/04 1630) FiO2 (%):  [60 %-100 %] 60 % (12/04 1626) Weight:  [82.6 kg (182 lb 1.6 oz)-94.3 kg (207 lb 14.3 oz)] 94.3 kg (207 lb 14.3 oz) (12/04 1415)  PHYSICAL EXAMINATION: General:  Chronically ill appearing young male, NAD  Neuro:  Awake alert, appropriate, C4 quad HEENT:  Mm dry, Bipap  Cardiovascular:  s1s2 rrr  Lungs:  resps even, labored on bipap, diminished bilat bases, few scattered rhonchi  Abdomen:  Soft Musculoskeletal:  Warm and dry, BUE contractures, no edema   Recent Labs Lab 06/27/2013 0908  NA 138  K 4.2  CL 100  CO2 28  BUN 12  CREATININE 0.29*  GLUCOSE 98    Recent Labs Lab 06/26/2013 0908  HGB 14.5  HCT 43.6  WBC 12.9*  PLT 189   Ct Angio Chest Pe W/cm &/or Wo Cm  06/10/2013   CLINICAL DATA:  Respiratory distress.  EXAM: CT ANGIOGRAPHY CHEST WITH CONTRAST  TECHNIQUE: Multidetector CT imaging of the chest was performed using the standard protocol during bolus administration of intravenous contrast. Multiplanar CT image reconstructions including MIPs were obtained to evaluate the vascular anatomy.  CONTRAST:  OMNIPAQUE IOHEXOL 350 MG/ML SOLN  COMPARISON:  Chest x-ray 06/27/2013.  FINDINGS: Right IJ line in stable position. Heart size normal. Thoracic aorta and great vessels unremarkable. No evidence of aneurysm or dissection. Pulmonary arteries are normal. No pulmonary embolus.  There is complete collapse of the right lung with shift of the mediastinum to the right. The right mainstem bronchus is completely occluded. This could be from  endobronchial tumor or of mucous plugging. Mild atelectatic changes noted throughout the left lung. No significant pleural effusion or pneumothorax.  Chest wall is intact. No supraclavicular or axillary adenopathy. The thyroid is unremarkable. No focal bony abnormality.  Review of the MIP images confirms the above findings.  IMPRESSION: Complete collapse of the right lung secondary to occlusion of the right mainstem bronchus. Bronchoscopic evaluation should be considered. These results will be called to the ordering clinician or representative by the Radiologist Assistant, and communication documented in  the PACS Dashboard.   Electronically Signed   By: Maisie Fus  Register   On: 10-Jun-2013 12:40   Dg Chest Port 1 View  06/10/2013   CLINICAL DATA:  Recent bronchoscopy and follow-up intubation.  EXAM: PORTABLE CHEST - 1 VIEW  COMPARISON:  Chest CT scan of June 10, 2013 at 12:17 p.m. and an earlier portable chest x-ray of 9:27 a.m.  FINDINGS: There has been partial re-expansion of the right lung reflecting and marked improvement from the earlier near total atelectasis due to mainstem bronchus occlusion. There remain coarse interstitial markings and likely some pleural fluid. The left lung is reasonably well inflated and exhibits no acute abnormality. The cardiac silhouette is not enlarged. The central pulmonary vascularity is prominent. The endotracheal tube tip lies approximately 3.5 cm above the carina. The esophagogastric tube tip in proximal port project off the inferior aspect of the film. A right internal jugular venous catheter tip lies in the region of the junction of the SVC with the right atrium.  IMPRESSION: 1. There has been partial Re expansion of the right lung. Increased interstitial density and likely pleural fluid persists. 2. The support tubes and lines appear to be in normal position.   Electronically Signed   By: David  [place]   On: Jun 10, 2013 15:54   Dg Chest Port 1 View  2013-06-10    CLINICAL DATA:  Respiratory distress  EXAM: PORTABLE CHEST - 1 VIEW  COMPARISON:  February 14, 2013  FINDINGS: Port-A-Cath tip is in the superior vena cava near the cavoatrial junction. No pneumothorax.  There is subtle consolidation in the medial right base. Lungs are otherwise clear. Heart size and pulmonary vascularity are normal. No adenopathy. There is postoperative change in the cervical spine.  IMPRESSION: Subtle consolidation medial right base. Lungs otherwise clear. Central catheter as described without pneumothorax.   Electronically Signed   By: Bretta Bang M.D.   On: 06-10-2013 09:37    ASSESSMENT / PLAN:  Acute hypoxic respiratory failure - at high risk PE, would certainly consider this given hypoxia disproportionate to CXR in quadreplegic HCAP  Respiratory acidosis  SIRS  ?UTI - previous multiple MDR (VRE, proteus, pseudomonas)    Discussed with patient who has had multiple previous trachs and lengthy hospitalizations. He is adamant that he does not want to be intubated, even if this means he may die.  He is alert, oriented and appropriate.  Dr. Manus Gunning and multiple RN's witnessed discussion.    PLAN -  DNR/DNI Cont bipap  Check BNP  Broad spectrum abx - would use zyvox  F/u CXR  If deteriorates further would discuss comfort  Consider CTA chest when more stable to r/o PE  Hospitalist admit to SDU   As pt has made it clear that he does not want intubation, aggressive care, PCCM signing off, please call back if needed.   Billy Fischer, NP 06/10/13  5:23 PM Pager: [phone] or [phone]  *Care during the described time interval was provided by me and/or other providers on the critical care team. I have reviewed this patient's available data, including medical history, events of note, physical examination and test results as part of my evaluation.  Discussed code status with patient at length, he is of clear mind and clearly states that he does not wish to for  intubation.  He understood that no intubation means no CPR/cardioversion/pressors and he stated that that is what he wants even if it means dying.  Will make full DNR and  TRH to admit.  BiPAP only for comfort.  CC time 40 minutes.  Patient seen and examined, agree with above note.  I dictated the care and orders written for this patient under my direction.    This admission note is a copy of Dr Percival Spanish consultation note performed in the ED. He is to be credited with the admission history and physical  Merwyn Katos, MD [phone]

## 2013-05-31 NOTE — Procedures (Signed)
Oral Intubation Procedure Note   Procedure: Intubation Indications: Respiratory insufficiency, mucus plug with R lung collapse, planned FOB Consent: obtained from pt and HCPOA Time Out: Verified patient identification, verified procedure, site/side was marked, verified correct patient position, special equipment/implants available, medications/allergies/relevent history reviewed, required imaging and test results available.   Pre-meds: midaz 2 mg IV, fentanyl 50 mcg IV, Etomidate 20 mg IV  Neuromuscular blockade: Rocuronium 50 mg IV  Laryngoscope: #3 MAC, Glidescope  Visualization: cords fully visualized with both standard and fiberoptic laryngoscopes  Procedure: Although cords are easily visualized, limited mandibular excursion and cervical mobility made passage of ETT difficult. Subsequently 8.0 ETT passed on first attempt using Glidescope and secured @ 24 cm at upper gums  Findings: thin gastric reflux secretions in oropharynx   Evaluation:  Pt tolerated procedure well without complications other than transient desaturation   Billy Fischer, MD ; [facility] [phone].  After 5:30 PM or weekends, call [phone]

## 2013-05-31 NOTE — Consult Note (Signed)
PULMONARY  / CRITICAL CARE MEDICINE  Name: Alan Sanford MRN: [id number] DOB: 1986/01/27    ADMISSION DATE:  06/02/2013 CONSULTATION DATE:  06/21/2013  REFERRING MD :  EDP  CHIEF COMPLAINT:  Resp failure   HPI -  27yo male SNF resident C4 quadriplegic since 2011 with chronic indwelling foley presented 12/4 with worsening SOB x 1 day requiring bipap.  Initially hypoxic with sats 70's, slightly improved on bipap but remains dyspneic.  Per SNF has had cough x 2 days with increased SOB.  Found to have subtle PNA on CXR with mild resp acidosis and PCCM consulted.  Denies chest pain, hemoptysis, syncope, headache, n/v/d, abd pain.      PAST MEDICAL HISTORY :  Past Medical History  Diagnosis Date  . Quadriplegia, C1-C4 complete   . Bipolar 1 disorder   . Respiratory failure, chronic   . GERD (gastroesophageal reflux disease)   . Pressure ulcer   . Anxiety   . Depression    Past Surgical History  Procedure Laterality Date  . Colostomy     Prior to Admission medications   Medication Sig Start Date End Date Taking? Authorizing Provider  baclofen (LIORESAL) 20 MG tablet Take 30 mg by mouth 3 (three) times daily. Take one and one-half tablet by mouth three times daily.    Historical Provider, MD  dabigatran (PRADAXA) 150 MG CAPS capsule Take 150 mg by mouth daily.     Historical Provider, MD  diazepam (VALIUM) 10 MG tablet Take 10 mg by mouth every 8 (eight) hours as needed for anxiety. 11/10/12   Tiffany L Reed, DO  divalproex (DEPAKOTE) 250 MG DR tablet Take 250 mg by mouth 2 (two) times daily. 250 mg in addition to 500 mg to equal 750 mg twice daily    Historical Provider, MD  divalproex (DEPAKOTE) 500 MG DR tablet Take 500 mg by mouth 2 (two) times daily. Take 500 mg in addition to 250 mg to equal 750 mg twice daily    Historical Provider, MD  ergocalciferol (VITAMIN D2) 50000 UNITS capsule Take 50,000 Units by mouth every Monday, Wednesday, and Friday.    Historical Provider, MD  ferrous  sulfate 325 (65 FE) MG tablet Take 325 mg by mouth 2 (two) times daily.     Historical Provider, MD  FLUoxetine (PROZAC) 20 MG capsule Take 20 mg by mouth daily. 20 mg daily in addition to 40 mg to equal 60 mg daily    Historical Provider, MD  FLUoxetine (PROZAC) 40 MG capsule Take 40 mg by mouth daily. 40 mg in addition to 20 mg to equal 60 mg daily    Historical Provider, MD  gabapentin (NEURONTIN) 800 MG tablet Take 800 mg by mouth 3 (three) times daily.    Historical Provider, MD  ibuprofen (ADVIL,MOTRIN) 800 MG tablet Take 800 mg by mouth every 6 (six) hours as needed for fever.    Historical Provider, MD  loratadine (CLARITIN) 10 MG tablet Take 10 mg by mouth daily.    Historical Provider, MD  magnesium oxide (MAG-OX) 400 MG tablet Take 400 mg by mouth daily.    Historical Provider, MD  Multiple Vitamin (MULTIVITAMIN WITH MINERALS) TABS tablet Take 1 tablet by mouth daily.    Historical Provider, MD  oxybutynin (DITROPAN-XL) 5 MG 24 hr tablet Take 5 mg by mouth 2 (two) times daily.    Historical Provider, MD  Oxycodone HCl 10 MG TABS Take one tablet by mouth every 4 hours as needed for  moderate to severe pain. 04/05/13   Claudie Revering, NP  QUEtiapine (SEROQUEL) 400 MG tablet Take 400 mg by mouth at bedtime.    Historical Provider, MD  temazepam (RESTORIL) 22.5 MG capsule Take 1 capsule (22.5 mg total) by mouth at bedtime as needed for sleep. Take one capsule by mouth every night at bedtime for sleep 02/27/13   Claudie Revering, NP  vitamin C (ASCORBIC ACID) 500 MG tablet Take 500 mg by mouth 2 (two) times daily.    Historical Provider, MD  Vitamin D, Ergocalciferol, (DRISDOL) 50000 UNITS CAPS capsule Take 50,000 Units by mouth.    Historical Provider, MD   Allergies  Allergen Reactions  . Cefepime Other (See Comments)    Itching (pt states he can take)  . Sulfa Antibiotics Other (See Comments)    Unknown   . Xarelto [Rivaroxaban] Other (See Comments)    Unknown     FAMILY HISTORY:  No  family history on file. SOCIAL HISTORY:  reports that he has never smoked. He has never used smokeless tobacco. He reports that he does not drink alcohol or use illicit drugs.  REVIEW OF SYSTEMS:   As per HPI, all other systems reviewed and were neg.    VITAL SIGNS: Pulse Rate:  [104] 104 (12/04 1000) BP: (114)/(93) 114/93 mmHg (12/04 1000) SpO2:  [91 %-93 %] 91 % (12/04 1003) FiO2 (%):  [100 %] 100 % (12/04 1003) Weight:  [182 lb 1.6 oz (82.6 kg)] 182 lb 1.6 oz (82.6 kg) (12/04 0932)  PHYSICAL EXAMINATION: General:  Chronically ill appearing young male, NAD  Neuro:  Awake alert, appropriate, C4 quad HEENT:  Mm dry, Bipap  Cardiovascular:  s1s2 rrr  Lungs:  resps even, labored on bipap, diminished bilat bases, few scattered rhonchi  Abdomen:  Soft Musculoskeletal:  Warm and dry, BUE contractures, no edema   Recent Labs Lab June 19, 2013 0908  NA 138  K 4.2  CL 100  CO2 28  BUN 12  CREATININE 0.29*  GLUCOSE 98    Recent Labs Lab 06/19/2013 0908  HGB 14.5  HCT 43.6  WBC 12.9*  PLT 189   Dg Chest Port 1 View  19-Jun-2013   CLINICAL DATA:  Respiratory distress  EXAM: PORTABLE CHEST - 1 VIEW  COMPARISON:  February 14, 2013  FINDINGS: Port-A-Cath tip is in the superior vena cava near the cavoatrial junction. No pneumothorax.  There is subtle consolidation in the medial right base. Lungs are otherwise clear. Heart size and pulmonary vascularity are normal. No adenopathy. There is postoperative change in the cervical spine.  IMPRESSION: Subtle consolidation medial right base. Lungs otherwise clear. Central catheter as described without pneumothorax.   Electronically Signed   By: Bretta Bang M.D.   On: 19-Jun-2013 09:37    ASSESSMENT / PLAN:  Acute hypoxic respiratory failure - at high risk PE, would certainly consider this given hypoxia disproportionate to CXR in quadreplegic HCAP  Respiratory acidosis  SIRS  ?UTI - previous multiple MDR (VRE, proteus, pseudomonas)     Discussed with patient who has had multiple previous trachs and lengthy hospitalizations. He is adamant that he does not want to be intubated, even if this means he may die.  He is alert, oriented and appropriate.  Dr. Manus Gunning and multiple RN's witnessed discussion.    PLAN -  DNR/DNI Cont bipap  Check BNP  Broad spectrum abx - would use zyvox  F/u CXR  If deteriorates further would discuss comfort  Consider CTA chest  when more stable to r/o PE  Hospitalist admit to SDU   As pt has made it clear that he does not want intubation, aggressive care, PCCM signing off, please call back if needed.   Danford Bad, NP 06/27/2013  10:06 AM Pager: [phone] or [phone]  *Care during the described time interval was provided by me and/or other providers on the critical care team. I have reviewed this patient's available data, including medical history, events of note, physical examination and test results as part of my evaluation.  Discussed code status with patient at length, he is of clear mind and clearly states that he does not wish to for intubation.  He understood that no intubation means no CPR/cardioversion/pressors and he stated that that is what he wants even if it means dying.  Will make full DNR and TRH to admit.  BiPAP only for comfort.  CC time 40 minutes.  Patient seen and examined, agree with above note.  I dictated the care and orders written for this patient under my direction.  Alyson Reedy, MD [phone]

## 2013-05-31 NOTE — ED Provider Notes (Signed)
CSN: [id number]     Arrival date & time 06/23/2013  4098 History   First MD Initiated Contact with Patient 06/17/2013 0914     No chief complaint on file.  (Consider location/radiation/quality/duration/timing/severity/associated sxs/prior Treatment) HPI Alan Sanford is a 27 y.o. male who presents to the emergency department by [facility] for concern of SOB.  Worsening SOB over last 12 hours.  RNs found him at [facility] and called for assistance.  EMS found patient hypoxic to 70s and dyspneic.  CPAP applied and patient brought in for further evaluation.  Highest saturation on CPAP was 88%.  No other prodromal symptoms known prior to last night.  Spoke with Art therapist at facility where patient lives.  She reports that the patient has had cough for last two days increased SOB over last 12 hours.  It sounds that they did some bloodwork, CXR, and EKG done overnight and were concerned at possibility of a bloodclot.    Past Medical History  Diagnosis Date  . Quadriplegia, C1-C4 complete   . Bipolar 1 disorder   . Respiratory failure, chronic   . GERD (gastroesophageal reflux disease)   . Pressure ulcer   . Anxiety   . Depression    Past Surgical History  Procedure Laterality Date  . Colostomy     No family history on file. History  Substance Use Topics  . Smoking status: Never Smoker   . Smokeless tobacco: Never Used  . Alcohol Use: No    Review of Systems  Unable to perform ROS: Acuity of condition    Allergies  Cefepime; Sulfa antibiotics; and Xarelto  Home Medications   Current Outpatient Rx  Name  Route  Sig  Dispense  Refill  . baclofen (LIORESAL) 20 MG tablet   Oral   Take 30 mg by mouth 3 (three) times daily. Take one and one-half tablet by mouth three times daily.         . dabigatran (PRADAXA) 150 MG CAPS capsule   Oral   Take 150 mg by mouth daily.          . diazepam (VALIUM) 10 MG tablet   Oral   Take 10 mg by mouth every 8 (eight) hours as needed for  anxiety.         . divalproex (DEPAKOTE) 250 MG DR tablet   Oral   Take 250 mg by mouth 2 (two) times daily. 250 mg in addition to 500 mg to equal 750 mg twice daily         . divalproex (DEPAKOTE) 500 MG DR tablet   Oral   Take 500 mg by mouth 2 (two) times daily. Take 500 mg in addition to 250 mg to equal 750 mg twice daily         . ergocalciferol (VITAMIN D2) 50000 UNITS capsule   Oral   Take 50,000 Units by mouth every Monday, Wednesday, and Friday.         . ferrous sulfate 325 (65 FE) MG tablet   Oral   Take 325 mg by mouth 2 (two) times daily.          Marland Kitchen FLUoxetine (PROZAC) 20 MG capsule   Oral   Take 20 mg by mouth daily. 20 mg daily in addition to 40 mg to equal 60 mg daily         . FLUoxetine (PROZAC) 40 MG capsule   Oral   Take 40 mg by mouth daily. 40 mg in addition  to 20 mg to equal 60 mg daily         . gabapentin (NEURONTIN) 800 MG tablet   Oral   Take 800 mg by mouth 3 (three) times daily.         Marland Kitchen ibuprofen (ADVIL,MOTRIN) 800 MG tablet   Oral   Take 800 mg by mouth every 6 (six) hours as needed for fever.         . loratadine (CLARITIN) 10 MG tablet   Oral   Take 10 mg by mouth daily.         . magnesium oxide (MAG-OX) 400 MG tablet   Oral   Take 400 mg by mouth daily.         . Multiple Vitamin (MULTIVITAMIN WITH MINERALS) TABS tablet   Oral   Take 1 tablet by mouth daily.         Marland Kitchen oxybutynin (DITROPAN-XL) 5 MG 24 hr tablet   Oral   Take 5 mg by mouth 2 (two) times daily.         . Oxycodone HCl 10 MG TABS      Take one tablet by mouth every 4 hours as needed for moderate to severe pain.   180 tablet   0   . QUEtiapine (SEROQUEL) 400 MG tablet   Oral   Take 400 mg by mouth at bedtime.         . temazepam (RESTORIL) 22.5 MG capsule   Oral   Take 1 capsule (22.5 mg total) by mouth at bedtime as needed for sleep. Take one capsule by mouth every night at bedtime for sleep   30 capsule   5   . vitamin C  (ASCORBIC ACID) 500 MG tablet   Oral   Take 500 mg by mouth 2 (two) times daily.         . Vitamin D, Ergocalciferol, (DRISDOL) 50000 UNITS CAPS capsule   Oral   Take 50,000 Units by mouth.          There were no vitals taken for this visit. Physical Exam  Nursing note and vitals reviewed. Constitutional: He is oriented to person, place, and time. He appears well-developed and well-nourished. He has a sickly appearance. He appears distressed.  HENT:  Head: Normocephalic and atraumatic.  Right Ear: External ear normal.  Left Ear: External ear normal.  Mouth/Throat: Oropharynx is clear and moist. No oropharyngeal exudate.  Eyes: Conjunctivae are normal. Pupils are equal, round, and reactive to light. Right eye exhibits no discharge.  Neck: Normal range of motion. Neck supple. No tracheal deviation present.  Cardiovascular: Normal rate, regular rhythm and intact distal pulses.   Pulmonary/Chest: Accessory muscle usage present. Tachypnea noted. He is in respiratory distress. He has no wheezes. He has rhonchi.  Abdominal: Soft. He exhibits no distension. There is no tenderness. There is no rebound and no guarding.  Musculoskeletal: Normal range of motion.  Neurological: He is alert and oriented to person, place, and time. GCS eye subscore is 4. GCS verbal subscore is 5. GCS motor subscore is 6.  Skin: Skin is warm and dry. No rash noted. He is not diaphoretic.  Psychiatric: He has a normal mood and affect.    ED Course  Procedures (including critical care time) Labs Review Labs Reviewed  CBC WITH DIFFERENTIAL - Abnormal; Notable for the following:    WBC 12.9 (*)    Monocytes Relative 15 (*)    Monocytes Absolute 2.0 (*)    All  other components within normal limits  COMPREHENSIVE METABOLIC PANEL - Abnormal; Notable for the following:    Creatinine, Ser 0.29 (*)    Albumin 3.1 (*)    AST 54 (*)    ALT 66 (*)    All other components within normal limits  POCT I-STAT 3, BLOOD  GAS (G3+) - Abnormal; Notable for the following:    pH, Arterial 7.310 (*)    pCO2 arterial 52.7 (*)    pO2, Arterial 72.0 (*)    Bicarbonate 26.5 (*)    All other components within normal limits  CULTURE, BLOOD (ROUTINE X 2)  CULTURE, BLOOD (ROUTINE X 2)  URINE CULTURE  PROTIME-INR  URINALYSIS, ROUTINE W REFLEX MICROSCOPIC  PRO B NATRIURETIC PEPTIDE  VALPROIC ACID LEVEL  CG4 I-STAT (LACTIC ACID)   Imaging Review No results found.  EKG Interpretation    Date/Time:  Thursday May 31 2013 09:06:16 EST Ventricular Rate:  115 PR Interval:  171 QRS Duration: 91 QT Interval:  324 QTC Calculation: 448 R Axis:   -116 Text Interpretation:  Sinus tachycardia Left anterior fascicular block Baseline wander in lead(s) V6 No significant change was found Confirmed by Manus Gunning  MD, STEPHEN ([id number]) on 06/05/2013 9:16:06 AM            MDM   1. Acute respiratory failure   2. Sepsis   3. Acute-on-chronic respiratory failure   4. Acute airway obstruction   5. Lung collapse     Alan Sanford is a 27 y.o. male with history of quadriplegia who presents to the emergency department in respiratory distress.  On arrival, patient tachypneic and hypoxic despite being on 100% CPAP.  Patient mentally with GCS of 15 and appropriate mentation.  Brought in to resuscitation room and put on to BiPAP.  Saturations improved to 95%.  Patient critical but stabilized.  At this point we discussed intubation.    Patient adamantly refused.  In no nonspecific terms he understood that there is a high likelihood that he could die if he did not accept the intubation.  Patient stated to [place], "Even if I go unconscious and it is inevitable that I die, I do not want to be intubated or put on a ventilator."  Myself, Dr. Manus Gunning, respiratory therapy, and nursing all had same conversation with him.  Patient left on BiPAP.  Critical care consulted.  Critical care physician and PA again also discussed intubation and patient  again refused.  Patient made DNR/DNI.  Patient understood gravity of situation.  Patient was alert and oriented to person, place, time, and situation.  Patient stated reason that he did not want to be intubated was his discomfort with the ventilator in previous situations.  Patient was deemed competent to make decision by all of [place] throughout his stay in the emergency department.  Critical care placed DNR/DNI order.  Patient spoke with aunt Alvis Lemmings over the phone and even after her urging still declined intubation.   Second POA then came to the hospital and at that point the CT scan showed complete collapse of R lung secondary to R mainstem bronchus occlusion.  Discussed with critical care and ICU physician evaluated in ED and made plan for temporary intubation and bronchoscopy.  ICU physician to admit.  Antibiotics provided per pharmacy and ICU consult.  Patient admitted in critical condition.    Arloa Koh, MD 06/10/2013 1511

## 2013-05-31 NOTE — Progress Notes (Signed)
UR Completed.  Alan Sanford [phone] 06/23/2013

## 2013-05-31 NOTE — Progress Notes (Signed)
ANTIBIOTIC CONSULT NOTE - INITIAL  Pharmacy Consult for Aztreonam/Vancomycin Indication: rule out sepsis  Allergies  Allergen Reactions  . Cefepime Other (See Comments)    Itching (pt states he can take)  . Sulfa Antibiotics Other (See Comments)    Unknown   . Xarelto [Rivaroxaban] Other (See Comments)    Unknown     Patient Measurements: Height: 5' 10.08" (178 cm) Weight: 182 lb 1.6 oz (82.6 kg) IBW/kg (Calculated) : 73.18 Adjusted Body Weight:    Vital Signs:   Intake/Output from previous day:   Intake/Output from this shift:    Labs:  Recent Labs  05/28/2013 0908  WBC 12.9*  HGB 14.5  PLT 189   Estimated Creatinine Clearance: 146.1 ml/min (by C-G formula based on Cr of 0.23). No results found for this basename: VANCOTROUGH, VANCOPEAK, VANCORANDOM, GENTTROUGH, GENTPEAK, GENTRANDOM, TOBRATROUGH, TOBRAPEAK, TOBRARND, AMIKACINPEAK, AMIKACINTROU, AMIKACIN,  in the last 72 hours   Microbiology: No results found for this or any previous visit (from the past 720 hour(s)).  Medical History: Past Medical History  Diagnosis Date  . Quadriplegia, C1-C4 complete   . Bipolar 1 disorder   . Respiratory failure, chronic   . GERD (gastroesophageal reflux disease)   . Pressure ulcer   . Anxiety   . Depression     Medications: see med rec  Assessment: Increasing SOB with sats in the 89's.  27 y/o M C4 quadriplegic since 2011 has chronic foley and colostomy. H/o UTIs with previous cultures showing VRE, Pseudomonas, Proteus morganella, but pt has previously remained asymptomatic per primary care note. Complex PMH also includes bipolar affective disorder, h/o recurrent DVTs/PE, chronic respiratory failure, GERD, pressure, anxiety, depression, chronic foley, colostomy, PICC.  Pertinent labs: WBC 12.9, LA 1.55, Scr 0.29 (estimated CrCl>100), HR 104  Spoke with Dr. Molli Knock who ok'd Zyvox therapy until r/o VRE urinary sepsis.  Goal of Therapy:  Sepsis coverage  Plan:  Zyvox  600mg  IV q12h. No renal adjustment required Aztreonam 2g IV x 1 in ER then 2g IV q8hr Will f/u for anticoagulation  Eliz Nigg S. Merilynn Finland, PharmD, BCPS Clinical Staff Pharmacist Pager [phone]  Misty Stanley Stillinger 06/18/2013,9:36 AM

## 2013-05-31 NOTE — Significant Event (Signed)
In conjunction with Dr Molli Knock, I spoke in detail with pt and his HCPOA. We assured him that we can keep him comfortable while he requires mech vent support. He agreed to intubation and FOB only under the agreement that we would extubate the next day regardless of how well his lung re-expands. While intubated, we will replace his Foley cath per his HCPOA's request.    Alan Fischer, MD ; [facility] service Mobile [phone].  After 5:30 PM or weekends, call [phone]

## 2013-06-01 ENCOUNTER — Inpatient Hospital Stay (HOSPITAL_COMMUNITY): Payer: Federal, State, Local not specified - PPO

## 2013-06-01 DIAGNOSIS — N390 Urinary tract infection, site not specified: Secondary | ICD-10-CM | POA: Diagnosis present

## 2013-06-01 LAB — COMPREHENSIVE METABOLIC PANEL
ALT: 42 U/L (ref 0–53)
BUN: 7 mg/dL (ref 6–23)
CO2: 25 mEq/L (ref 19–32)
Calcium: 8.4 mg/dL (ref 8.4–10.5)
Creatinine, Ser: 0.29 mg/dL — ABNORMAL LOW (ref 0.50–1.35)
GFR calc Af Amer: >90 mL/min (ref >90)
GFR calc non Af Amer: >90 mL/min (ref >90)
Glucose, Bld: 99 mg/dL (ref 70–99)
Total Bilirubin: 0.5 mg/dL (ref 0.3–1.2)
Total Protein: 6.8 g/dL (ref 6.0–8.3)

## 2013-06-01 LAB — GLUCOSE, CAPILLARY
Glucose-Capillary: 116 mg/dL — ABNORMAL HIGH (ref 70–99)
Glucose-Capillary: 144 mg/dL — ABNORMAL HIGH (ref 70–99)
Glucose-Capillary: 90 mg/dL (ref 70–99)

## 2013-06-01 LAB — URINE CULTURE: Colony Count: >=100000

## 2013-06-01 LAB — CBC
HCT: 38 % — ABNORMAL LOW (ref 39.0–52.0)
Hemoglobin: 13 g/dL (ref 13.0–17.0)
MCH: 33.2 pg (ref 26.0–34.0)
MCHC: 34.2 g/dL (ref 30.0–36.0)
MCV: 96.9 fL (ref 78.0–100.0)
RBC: 3.92 MIL/uL — ABNORMAL LOW (ref 4.22–5.81)

## 2013-06-01 MED ORDER — POTASSIUM CHLORIDE CRYS ER 10 MEQ PO TBCR
EXTENDED_RELEASE_TABLET | ORAL | Status: AC
  Filled 2013-06-01: qty 1

## 2013-06-01 MED ORDER — ALBUTEROL SULFATE (5 MG/ML) 0.5% IN NEBU
2.5000 mg | INHALATION_SOLUTION | RESPIRATORY_TRACT | Status: DC
  Administered 2013-06-01 – 2013-06-04 (×16): 2.5 mg via RESPIRATORY_TRACT
  Filled 2013-06-01 (×16): qty 0.5

## 2013-06-01 MED ORDER — DABIGATRAN ETEXILATE MESYLATE 150 MG PO CAPS
150.0000 mg | ORAL_CAPSULE | Freq: Two times a day (BID) | ORAL | Status: DC
  Administered 2013-06-01 – 2013-06-14 (×26): 150 mg via ORAL
  Filled 2013-06-01 (×29): qty 1

## 2013-06-01 MED ORDER — CHLORHEXIDINE GLUCONATE 0.12 % MT SOLN
15.0000 mL | Freq: Two times a day (BID) | OROMUCOSAL | Status: DC
  Administered 2013-06-01 – 2013-06-15 (×25): 15 mL via OROMUCOSAL
  Filled 2013-06-01 (×25): qty 15

## 2013-06-01 MED ORDER — OXYBUTYNIN CHLORIDE ER 5 MG PO TB24
5.0000 mg | ORAL_TABLET | Freq: Two times a day (BID) | ORAL | Status: DC
  Administered 2013-06-01 – 2013-06-14 (×25): 5 mg via ORAL
  Filled 2013-06-01 (×28): qty 1

## 2013-06-01 MED ORDER — GABAPENTIN 800 MG PO TABS
800.0000 mg | ORAL_TABLET | Freq: Three times a day (TID) | ORAL | Status: DC
  Filled 2013-06-01 (×2): qty 1

## 2013-06-01 MED ORDER — ALBUTEROL SULFATE (5 MG/ML) 0.5% IN NEBU
2.5000 mg | INHALATION_SOLUTION | Freq: Four times a day (QID) | RESPIRATORY_TRACT | Status: DC
  Administered 2013-06-01 (×2): 2.5 mg via RESPIRATORY_TRACT
  Filled 2013-06-01 (×2): qty 0.5

## 2013-06-01 MED ORDER — BACLOFEN 10 MG PO TABS
10.0000 mg | ORAL_TABLET | Freq: Three times a day (TID) | ORAL | Status: DC
  Administered 2013-06-01 – 2013-06-14 (×37): 10 mg via ORAL
  Filled 2013-06-01 (×41): qty 1

## 2013-06-01 MED ORDER — BIOTENE DRY MOUTH MT LIQD
15.0000 mL | Freq: Four times a day (QID) | OROMUCOSAL | Status: DC
  Administered 2013-06-01 – 2013-06-15 (×48): 15 mL via OROMUCOSAL

## 2013-06-01 MED ORDER — FUROSEMIDE 40 MG PO TABS
40.0000 mg | ORAL_TABLET | Freq: Every day | ORAL | Status: DC
  Administered 2013-06-01 – 2013-06-04 (×4): 40 mg via ORAL
  Filled 2013-06-01 (×4): qty 1

## 2013-06-01 MED ORDER — POTASSIUM CHLORIDE CRYS ER 20 MEQ PO TBCR
40.0000 meq | EXTENDED_RELEASE_TABLET | Freq: Once | ORAL | Status: AC
  Administered 2013-06-01: 40 meq via ORAL
  Filled 2013-06-01: qty 2

## 2013-06-01 MED ORDER — CHLORHEXIDINE GLUCONATE 0.12 % MT SOLN
OROMUCOSAL | Status: AC
  Administered 2013-06-01: 15 mL
  Filled 2013-06-01: qty 15

## 2013-06-01 MED ORDER — GABAPENTIN 400 MG PO CAPS
800.0000 mg | ORAL_CAPSULE | Freq: Three times a day (TID) | ORAL | Status: DC
  Administered 2013-06-01 – 2013-06-14 (×37): 800 mg via ORAL
  Filled 2013-06-01 (×41): qty 2

## 2013-06-01 MED ORDER — ACETYLCYSTEINE 20 % IN SOLN
3.0000 mL | Freq: Four times a day (QID) | RESPIRATORY_TRACT | Status: DC
  Administered 2013-06-01 (×3): 3 mL via RESPIRATORY_TRACT
  Administered 2013-06-02: 17:00:00 via RESPIRATORY_TRACT
  Administered 2013-06-02: 3 mL via RESPIRATORY_TRACT
  Administered 2013-06-02 (×2): via RESPIRATORY_TRACT
  Administered 2013-06-03 – 2013-06-05 (×10): 3 mL via RESPIRATORY_TRACT
  Filled 2013-06-01 (×20): qty 4

## 2013-06-01 NOTE — Progress Notes (Signed)
Intermittent episodes of de-sating, patient continues on non re-breather. Current SpO 91-93. Elink contacted for any recommendation. Per Patient , "i do not want to be re-intubated, i do not want to be trached. POA contacted but unavailable.

## 2013-06-01 NOTE — Clinical Social Work Psychosocial (Signed)
Clinical Social Work Department BRIEF PSYCHOSOCIAL ASSESSMENT 06/01/2013  Patient:  Alan Sanford, Alan Sanford     Account Number:  [id number]     Admit date:  06/02/2013  Clinical Social Worker:  Read Drivers  Date/Time:  06/01/2013 01:23 PM  Referred by:  Care Management  Date Referred:  06/01/2013 Referred for  SNF Placement   Other Referral:   pt is from [facility] and will be returning upon dc   Interview type:  Other - See comment Other interview type:   CSW contacted HCPOA - Carolynne Edouard.    PSYCHOSOCIAL DATA Living Status:  FACILITY Admitted from facility:  [facility] Level of care:  Skilled Nursing Facility Primary support name:  Newman Nip and Marina Goodell Primary support relationship to patient:  FAMILY Degree of support available:   strong    CURRENT CONCERNS Current Concerns  Post-Acute Placement   Other Concerns:   none    SOCIAL WORK ASSESSMENT / PLAN Pt not able to participate in assessment.  CSW contacted HCPOA who was listed on the pt's paper chart - Carolynne Edouard at [phone].  Noreene Larsson reports that pt is from [facility] and will be returning upon dc.  Family is very happy with the level of care that pt receives from SNF. Noreene Larsson states that she is an ICU RN and is very involved in pt medical care.  Noreene Larsson reports that Avon Gully is shared between herself, Renae and Roe Coombs.  Marina Goodell, pt dad is financial POA.    CSW will continue to follow.  Medical team projecting return to [facility] possibly over the weekend. Noreene Larsson, [place], aware.    If pt to dc over the weekend, please contact Federico Flake at [facility] at [phone] to coordinate transfer.    Noreene Larsson also stated that she would like to make medical staff aware that she will be setting pt up with Pulminologist in [place] upon dc.  She reports needing no assistance with this set-up.  Pulminologist of choice is MD, Javaid.   Assessment/plan status:  Psychosocial Support/Ongoing Assessment of  Needs Other assessment/ plan:   none   Information/referral to community resources:   SNF    PATIENT'S/FAMILY'S RESPONSE TO PLAN OF CARE: Pt HCPOA was very appreciative of support received.       Vickii Penna, LCSWA [phone]  Clinical Social Work

## 2013-06-01 NOTE — Consult Note (Signed)
PULMONARY  / CRITICAL CARE MEDICINE  Name: Alan Sanford MRN: [id number] DOB: 03/12/1986    ADMISSION DATE:  06/03/2013 CONSULTATION DATE:  06/10/2013  REFERRING MD :  EDP PRIMARY SERVICE:  PCCM  CHIEF COMPLAINT:  Acute respiratory failure  BRIEF PATIENT DESCRIPTION: 27 yo C4 quadriplegicresident  SNF admitted with worsening dyspnea.  Chest imaging revealed complete R lung collapse.  Intubated for bronchoscopy.  SIGNIFICANT EVENTS / STUDIES:  12/04  Admitted with acute respiratory failure, failed BiPAP 12/04  CTA chest:  R lung collapse / occlusion of mainstem bronchus 12/04  Bronchoscopy >>> Copious amounts of purulent secretions aspirated from R lung  LINES / TUBES: OETT  12/04 >>> OGT 12/04 >>> R [place] midline - chronic Foley - chronic  CULTURES: 12/04 Blood >>> 12/04 Urine >>> 12/04 Respiratory >>>  ANTIBIOTICS: Aztreonam 12/04 >>> Zyvox 12/04 >>>  INTERVAL HISTORY:  Weaning well on SBT.  VITAL SIGNS: Temp:  [98.1 F (36.7 C)-100.9 F (38.3 C)] 98.1 F (36.7 C) (12/05 0802) Pulse Rate:  [76-118] 99 (12/05 0900) Resp:  [6-33] 16 (12/05 0900) BP: (76-143)/(39-97) 102/81 mmHg (12/05 0900) SpO2:  [89 %-100 %] 93 % (12/05 0900) FiO2 (%):  [40 %-100 %] 40 % (12/05 0900) Weight:  [94.3 kg (207 lb 14.3 oz)] 94.3 kg (207 lb 14.3 oz) (12/04 1415) HEMODYNAMICS:   VENTILATOR SETTINGS: Vent Mode:  [-] PSV;CPAP FiO2 (%):  [40 %-100 %] 40 % Set Rate:  [16 bmp] 16 bmp Vt Set:  [500 mL-590 mL] 590 mL PEEP:  [5 cmH20-8 cmH20] 5 cmH20 Pressure Support:  [5 cmH20] 5 cmH20 Plateau Pressure:  [19 cmH20-26 cmH20] 19 cmH20 INTAKE / OUTPUT: Intake/Output     12/04 0701 - 12/05 0700 12/05 0701 - 12/06 0700   I.V. (mL/kg) 1584 (16.8) 176.3 (1.9)   IV Piggyback 350 300   Total Intake(mL/kg) 1934 (20.5) 476.3 (5.1)   Urine (mL/kg/hr) 750    Total Output 750     Net +1184 +476.3        Urine Occurrence  1 x     PHYSICAL EXAMINATION: General:  Comfortable, no distress on  SBT Neuro:  Quadriplegia, awakes to stimulation HEENT:  OETT Cardiovascular:  Regular, no murmurs Lungs:  Bilateral air entry Abdomen:  Soft, bowel sounds present Musculoskeletal:  Contractures, anasarca  LABS: CBC  Recent Labs Lab 06/27/2013 0908 06/01/13 0500  WBC 12.9* 14.6*  HGB 14.5 13.0  HCT 43.6 38.0*  PLT 189 164   Coag's  Recent Labs Lab  0908  INR 1.10   BMET  Recent Labs Lab 06/18/2013 0908 06/01/13 0500  NA 138 137  K 4.2 3.3*  CL 100 102  CO2 28 25  BUN 12 7  CREATININE 0.29* 0.29*  GLUCOSE 98 99   Electrolytes  Recent Labs Lab 06/24/2013 0908 06/01/13 0500  CALCIUM 9.1 8.4   Sepsis Markers  Recent Labs Lab 05/30/2013 0925  LATICACIDVEN 1.55   ABG  Recent Labs Lab 06/07/2013 0950  PHART 7.310*  PCO2ART 52.7*  PO2ART 72.0*   Liver Enzymes  Recent Labs Lab 06/11/2013 0908 06/01/13 0500  AST 54* 29  ALT 66* 42  ALKPHOS 81 66  BILITOT 0.3 0.5  ALBUMIN 3.1* 2.5*   Cardiac Enzymes  Recent Labs Lab 06/13/2013 0908  PROBNP 68.1   Glucose  Recent Labs Lab 06/26/2013 1427 06/03/2013 2357 06/01/13 0426 06/01/13 0801  GLUCAP 133* 144* 116* 90   CXR:  12/05 >>> Hardware in good position, bilateral airspace disease, R  lung aeration significantly improved  ASSESSMENT / PLAN:  PULMONARY A:  HCAP.  R lung collapse secondary to mucus plug. Acute on chronic respiratory failure. Difficult intubation. P:   Gaol SpO2>92, pH>7.30 Extubate Supplemental oxygen PRN Albuterol / Atrovent / CPT qid  CARDIOVASCULAR A: No active issues. P:  Goal MAP>60  RENAL A:  Hypokalemia. P:   Trend BMP Chronic foley K 40 x 1 D/c IVF  GASTROINTESTINAL A:  No active issues. P:   Advance diet as tolerated D/c Protonix as extubated  HEMATOLOGIC A:  Chronic anticoagulation for multiple VTE. P:  Trend CBC D/c Lovenox Restart Pradaxa  INFECTIOUS A:  HCAP. P:   Cultures and antibiotics as above  ENDOCRINE  A:  No active  issues. P:   No intervention required   NEUROLOGIC A:  C4 quadriplegia. Bipolar disorder. P:   Preadmission Abilify, Seroquel, Cogentin, Klonopin, Valproic acid Hold Prozac as on Zyvox  I have personally obtained history, examined patient, evaluated and interpreted laboratory and imaging results, reviewed medical records, formulated assessment / plan and placed orders.  CRITICAL CARE:  The patient is critically ill with multiple organ systems failure and requires high complexity decision making for assessment and support, frequent evaluation and titration of therapies, application of advanced monitoring technologies and extensive interpretation of multiple databases. Critical Care Time devoted to patient care services described in this note is 35 minutes.   Lonia Farber, MD Pulmonary and Critical Care Medicine [facility] Pager: [phone]  06/01/2013, 10:05 AM

## 2013-06-01 NOTE — Progress Notes (Signed)
Unable to maintain oxygen saturation, tried Quad cough without success, Oxygen increased to 6 liters and will be increased to non re-breatherr if pt continues to not maintain sats. Breath sounds congest. Incentive spirometer initiated, patient able to pull 1250. Anders Simmonds called to bedside to evaluate. Concerns for replugging

## 2013-06-01 NOTE — Procedures (Signed)
Extubation Procedure Note  Patient Details:   Name: CANAAN HOLZER DOB: 15-Mar-1986 MRN: [id number]   Airway Documentation:     Evaluation  O2 sats: stable throughout Complications: No apparent complications Patient did tolerate procedure well. Bilateral Breath Sounds: Rhonchi Suctioning: Airway Yes  Pt placed on 4L [place] and tolerating well.   Devra Dopp D 06/01/2013, 10:27 AM

## 2013-06-01 NOTE — Consult Note (Addendum)
WOC wound consult note Reason for Consult: evaluation of pressure ulcer. Pt admitted from SNF with noted areas on the sacrum and left ischial.  He has scarring noted over the sacrum, no open wounds at this time over the sacral region. He is high risk for breakdown with hx of previous breakdown.  He has one small area on the left ischium, again it is very apparent this was much larger at some point, now he has one small area open but it is clean and moist Wound type: Pressure ulcer history, unclear of the Stage when these were at their worst, however now only one small partial thickness skin area of the left ischium. Current area Stage III left ischium Pressure Ulcer POA: Yes x 1 Measurement: 0.5cm x 1.0cm x 0.2cm  Wound bed: clean, pink, moist Drainage (amount, consistency, odor) none Periwound:intact with scarring from reepithelialization Dressing procedure/placement/frequency: continue foam dressings for protection and prevention of sheer.  On Sport bed here in the ICU, will need LALM should he transfer from the ICU. Pt noted to have some cracking of the heels, will add Prevalon boots for offloading. Pt noted to have LLQ, colostomy, reviewed record. This is a diverting colostomy performed when this patient had multiple open pressure ulcers of the sacrum and ischium.  Will order 2pc pouch for room, for changes as needed while pt inpatient.  Discussed POC with patient and bedside nurse.  Re consult if needed, will not follow at this time. Thanks  Tyrianna Lightle [employer], CWOCN [phone])

## 2013-06-01 NOTE — Progress Notes (Signed)
Nutrition Education Note  RD drawn to chart secondary to current prescription of Zyvox, a medication with potential interactions with high tyramine-containing foods.  Body mass index is 29.76 kg/(m^2). Pt meets criteria for overweight based on current BMI.  Current diet order is NPO. Patient was just extubated this morning. Usually eats a PO diet per discussion in ICU rounds. Plans to advance diet today. Additional labs and medications reviewed.   Hospital diet is not elevated in tyramine, therefore, expect no food-nutrient interactions while patient is receiving Zyvox. If nutrition education desired, please re-consult RD.  Joaquin Courts, RD, LDN, CNSC Pager [phone] After Hours Pager [phone]

## 2013-06-01 NOTE — Discharge Summary (Deleted)
   ERROR Signed: Gauri Galvao,PETE 06/01/2013, 1:59 PM

## 2013-06-02 ENCOUNTER — Inpatient Hospital Stay (HOSPITAL_COMMUNITY): Payer: Federal, State, Local not specified - PPO

## 2013-06-02 DIAGNOSIS — R609 Edema, unspecified: Secondary | ICD-10-CM

## 2013-06-02 LAB — GLUCOSE, CAPILLARY
Glucose-Capillary: 79 mg/dL (ref 70–99)
Glucose-Capillary: 90 mg/dL (ref 70–99)
Glucose-Capillary: 86 mg/dL (ref 70–99)

## 2013-06-02 LAB — CBC
MCHC: 33.2 g/dL (ref 30.0–36.0)
MCV: 98.9 fL (ref 78.0–100.0)
Platelets: 148 10*3/uL — ABNORMAL LOW (ref 150–400)
RBC: 3.53 MIL/uL — ABNORMAL LOW (ref 4.22–5.81)
RDW: 14.1 % (ref 11.5–15.5)
WBC: 10 10*3/uL (ref 4.0–10.5)

## 2013-06-02 LAB — BASIC METABOLIC PANEL
BUN: 7 mg/dL (ref 6–23)
Creatinine, Ser: 0.23 mg/dL — ABNORMAL LOW (ref 0.50–1.35)
GFR calc Af Amer: >90 mL/min (ref >90)
GFR calc non Af Amer: >90 mL/min (ref >90)
Potassium: 3 mEq/L — ABNORMAL LOW (ref 3.5–5.1)
Sodium: 139 mEq/L (ref 135–145)

## 2013-06-02 MED ORDER — FUROSEMIDE 10 MG/ML IJ SOLN: 40.0000 mg | Freq: Once | INTRAMUSCULAR | Status: DC

## 2013-06-02 MED ORDER — POTASSIUM CHLORIDE CRYS ER 20 MEQ PO TBCR
40.0000 meq | EXTENDED_RELEASE_TABLET | ORAL | Status: AC
  Administered 2013-06-02 (×2): 40 meq via ORAL
  Filled 2013-06-02: qty 2

## 2013-06-02 NOTE — Progress Notes (Signed)
Pt. Has had an issue with sats dropping into the 80's, which has prevented him from being able to perform the IPPB. RT will continue to make attempts.

## 2013-06-02 NOTE — Progress Notes (Signed)
PULMONARY  / CRITICAL CARE MEDICINE  Name: Alan Sanford MRN: [id number] DOB: 11-02-1985    ADMISSION DATE:  06/14/2013 CONSULTATION DATE:  06/03/2013  REFERRING MD :  EDP PRIMARY SERVICE:  PCCM  CHIEF COMPLAINT:  Acute respiratory failure  BRIEF PATIENT DESCRIPTION: 27 yo C4 quadriplegicresident  SNF admitted with worsening dyspnea.  Chest imaging revealed complete R lung collapse.  Intubated for bronchoscopy.  SIGNIFICANT EVENTS / STUDIES:  12/04  Admitted with acute respiratory failure, failed BiPAP 12/04  CTA chest:  R lung collapse / occlusion of mainstem bronchus 12/04  Bronchoscopy >>> Copious amounts of purulent secretions aspirated from R lung 12/05  Extubated 12/06  Family ( medical POA) / patient discussion >>> Aggressive medical management, but DNI/DNR and palliation if wose  LINES / TUBES: OETT  12/04 >>> 12/05 OGT 12/04 >>> 12/05 R [place] midline - chronic Foley - chronic  CULTURES: 12/04 Blood >>> 12/04 Urine >>> multiple morphotypes  12/04 Respiratory >>>  ANTIBIOTICS: Aztreonam 12/04 >>> Zyvox 12/04 >>>  INTERVAL HISTORY:  Several episodes of desaturation last night.  VITAL SIGNS: Temp:  [98.8 F (37.1 C)-101.1 F (38.4 C)] 98.9 F (37.2 C) (12/06 0744) Pulse Rate:  [97-116] 100 (12/06 0600) Resp:  [14-34] 21 (12/06 0600) BP: (77-154)/(38-80) 83/42 mmHg (12/06 0600) SpO2:  [87 %-98 %] 93 % (12/06 1009)  HEMODYNAMICS:   VENTILATOR SETTINGS:   INTAKE / OUTPUT: Intake/Output     12/05 0701 - 12/06 0700 12/06 0701 - 12/07 0700   I.V. (mL/kg) 396.3 (4.2)    IV Piggyback 600    Total Intake(mL/kg) 996.3 (10.6)    Urine (mL/kg/hr) 2500 (1.1)    Total Output 2500     Net -1503.7          Urine Occurrence 1 x      PHYSICAL EXAMINATION: General:  Comfortable Neuro:  Quadriplegia, awake, alert HEENT:  Moist membranes, oxygen mask on Cardiovascular:  Regular, no murmurs Lungs:  Bilateral diminished air entry, rhonchi  Abdomen:  Soft, bowel  sounds present Musculoskeletal:  Contractures, anasarca  LABS: CBC  Recent Labs Lab 06/09/2013 0908 06/01/13 0500  WBC 12.9* 14.6*  HGB 14.5 13.0  HCT 43.6 38.0*  PLT 189 164   Coag's  Recent Labs Lab 06/06/2013 0908  INR 1.10   BMET  Recent Labs Lab 06/25/2013 0908 06/01/13 0500 06/02/13 0904  NA 138 137 139  K 4.2 3.3* 3.0*  CL 100 102 104  CO2 28 25 25   BUN 12 7 7   CREATININE 0.29* 0.29* 0.23*  GLUCOSE 98 99 75   Electrolytes  Recent Labs Lab 05/28/2013 0908 06/01/13 0500  CALCIUM 9.1 8.4   Sepsis Markers  Recent Labs Lab  0925  LATICACIDVEN 1.55   ABG  Recent Labs Lab 06/05/2013 0950  PHART 7.310*  PCO2ART 52.7*  PO2ART 72.0*   Liver Enzymes  Recent Labs Lab 06/14/2013 0908 06/01/13 0500  AST 54* 29  ALT 66* 42  ALKPHOS 81 66  BILITOT 0.3 0.5  ALBUMIN 3.1* 2.5*   Cardiac Enzymes  Recent Labs Lab 06/17/2013 0908  PROBNP 68.1   Glucose  Recent Labs Lab 06/01/13 1126 06/01/13 1522 06/01/13 1922 06/02/13 0003 06/02/13 0423 06/02/13 0711  GLUCAP 94 81 83 104* 79 90   CXR:  12/06 >>> Recurrent R lung copllapse  ASSESSMENT / PLAN:  PULMONARY A:  HCAP.  R lung collapse secondary to mucus plug, recurrent.  Doubt effusion.  Acute on chronic respiratory failure. Difficult intubation. P:  Gaol SpO2>92 Supplemental oxygen PRN Albuterol / Atrovent / CPT qid Cough assist / IPPB  CARDIOVASCULAR A: No active issues. P:  Goal MAP>60  RENAL A:  Hypokalemia. P:   Trend BMP Chronic foley Lasix 40 daily K 40 x 2  GASTROINTESTINAL A:  No active issues. P:   Diet  HEMATOLOGIC A:  Chronic anticoagulation for multiple VTE. P:  Trend CBC Preadmission Pradaxa  INFECTIOUS A:  HCAP. P:   Cultures and antibiotics as above  ENDOCRINE  A:  No active issues. P:   No intervention required   NEUROLOGIC A:  C4 quadriplegia. Bipolar disorder. P:   Preadmission Baclofen, Abilify, Seroquel, Cogentin, Klonopin,  Neurontin, Valproic acid Hold Prozac as on Zyvox  I have personally obtained history, examined patient, evaluated and interpreted laboratory and imaging results, reviewed medical records, formulated assessment / plan and placed orders.  CRITICAL CARE:  The patient is critically ill with multiple organ systems failure and requires high complexity decision making for assessment and support, frequent evaluation and titration of therapies, application of advanced monitoring technologies and extensive interpretation of multiple databases. Critical Care Time devoted to patient care services described in this note is 35 minutes.   Lonia Farber, MD Pulmonary and Critical Care Medicine [facility] Pager: [phone]  06/02/2013, 10:35 AM

## 2013-06-03 ENCOUNTER — Inpatient Hospital Stay (HOSPITAL_COMMUNITY): Payer: Federal, State, Local not specified - PPO

## 2013-06-03 DIAGNOSIS — R0902 Hypoxemia: Secondary | ICD-10-CM | POA: Insufficient documentation

## 2013-06-03 LAB — GLUCOSE, CAPILLARY
Glucose-Capillary: 101 mg/dL — ABNORMAL HIGH (ref 70–99)
Glucose-Capillary: 132 mg/dL — ABNORMAL HIGH (ref 70–99)
Glucose-Capillary: 84 mg/dL (ref 70–99)
Glucose-Capillary: 86 mg/dL (ref 70–99)
Glucose-Capillary: 92 mg/dL (ref 70–99)

## 2013-06-03 LAB — CULTURE, RESPIRATORY W GRAM STAIN

## 2013-06-03 LAB — BASIC METABOLIC PANEL
CO2: 27 mEq/L (ref 19–32)
Calcium: 8.5 mg/dL (ref 8.4–10.5)
Chloride: 98 mEq/L (ref 96–112)
Creatinine, Ser: 0.29 mg/dL — ABNORMAL LOW (ref 0.50–1.35)
GFR calc non Af Amer: >90 mL/min (ref >90)
Glucose, Bld: 80 mg/dL (ref 70–99)

## 2013-06-03 LAB — CBC
HCT: 37.5 % — ABNORMAL LOW (ref 39.0–52.0)
Hemoglobin: 12.7 g/dL — ABNORMAL LOW (ref 13.0–17.0)
MCHC: 33.9 g/dL (ref 30.0–36.0)
MCV: 97.9 fL (ref 78.0–100.0)
Platelets: 176 10*3/uL (ref 150–400)
RBC: 3.83 MIL/uL — ABNORMAL LOW (ref 4.22–5.81)
WBC: 13 10*3/uL — ABNORMAL HIGH (ref 4.0–10.5)

## 2013-06-03 LAB — CULTURE, RESPIRATORY

## 2013-06-03 MED ORDER — FLUOXETINE HCL 20 MG PO CAPS
40.0000 mg | ORAL_CAPSULE | Freq: Every day | ORAL | Status: DC
  Administered 2013-06-04 – 2013-06-14 (×11): 40 mg via ORAL
  Filled 2013-06-03 (×11): qty 2

## 2013-06-03 MED ORDER — SODIUM CHLORIDE 0.9 % IV BOLUS (SEPSIS)
500.0000 mL | Freq: Once | INTRAVENOUS | Status: AC
  Administered 2013-06-03: 500 mL via INTRAVENOUS

## 2013-06-03 MED ORDER — VANCOMYCIN HCL IN DEXTROSE 1-5 GM/200ML-% IV SOLN
1000.0000 mg | Freq: Three times a day (TID) | INTRAVENOUS | Status: DC
  Administered 2013-06-03 – 2013-06-05 (×6): 1000 mg via INTRAVENOUS
  Filled 2013-06-03 (×7): qty 200

## 2013-06-03 MED ORDER — MORPHINE SULFATE 2 MG/ML IJ SOLN
2.0000 mg | INTRAMUSCULAR | Status: DC | PRN
Start: 2013-06-03 — End: 2013-06-05
  Administered 2013-06-04: 2 mg via INTRAVENOUS
  Filled 2013-06-03: qty 1

## 2013-06-03 MED ORDER — VANCOMYCIN HCL 10 G IV SOLR
1750.0000 mg | Freq: Once | INTRAVENOUS | Status: AC
  Administered 2013-06-03: 1750 mg via INTRAVENOUS
  Filled 2013-06-03: qty 1750

## 2013-06-03 MED ORDER — POTASSIUM CHLORIDE CRYS ER 20 MEQ PO TBCR
40.0000 meq | EXTENDED_RELEASE_TABLET | Freq: Once | ORAL | Status: AC
  Administered 2013-06-03: 40 meq via ORAL
  Filled 2013-06-03: qty 2

## 2013-06-03 NOTE — Progress Notes (Signed)
**Note De-Identified  Obfuscation** RT note: patient removed from BIPAP and placed on 100% NRB; patient tolerated well.  RT to continue.

## 2013-06-03 NOTE — Progress Notes (Signed)
Patient ID: Alan Sanford, male   DOB: [birth year], 27 y.o.   MRN: [id number]  This is an acute visit.  Level of care skilled.  Facility [place].  The date is 05/30/2013.  Chief complaint-acute visit secondary to hypoxia.  History of present illness.  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  Also a history of pulmonary embolism he does continue on Pradaxa  He also has a history of osteomyelitis-he does have a colostomy as well as indwelling Foley catheter.  Apparently late this evening vital signs revealed a decreased O2 saturation in the mid 80s-patient also was complaining of some shortness of breath.  He does not complaining of any chest pain dizziness headache.  With oxygen application it appears his O2 sats have improved into the low 90s.  Although he still states he is somewhat short of breath.  His blood pressure also is somewhat low at 90/70 patient states this is not really new for him although per chart review it appears it is a bit lower than his baseline  He does not appear to be in distress.  Family medical social history has been reviewed her previous progress notes including 10/18/2012.  Medications have been reviewed per MAR.  Review of systems.  denying any fever or chills.  Respiratory-is complaining of shortness of breath with occasional cough-.  Cardiac-denies any chest pain has some baseline edema.  GU has a chronic indwelling Foley catheter has some history of UTIs although recent cultures appear to be colonization.  Muscle skeletal as noted he is a functioning quadriplegic is not complaining of pain.  Neurologic-as stated above does not complaining of headache or dizziness.  Psych he is alert and oriented with history of bipolar disorder which has been fairly stable.  Physical exam.  He is afebrile respirations are 21 pulse of 80  blood pressure 94/70 O2 saturation initially in the mid low 80s this has come up to low 90s he now has oxygen--prior to oxygen actually at times his O2 saturation would get up to 90.  In general this is a pleasant young male in no distress resting comfortably in bed.  The skin is warm and dry.  Chest-there are decreased breath sounds bilaterally he does not appear to have any labored breathing at this time I cannot appreciate any congestion.  Heart-is regular rate and rhythm without murmur gallop or rub.  Abdomen-colostomy site appears within normal limits.  It is soft nontender positive bowel sounds.  GU-does have Foley catheter draining amber colored urine.  Extremities he is a functioning quadriplegic he has baseline lower extremity edema of his legs bilaterally.  Labs.  05/07/2013.  Sodium 140 potassium 3.6 BUN 10 creatinine 0.3.  BNP-6.6.  04/20/2013.  WBC 8.1 hemoglobin 13.6 platelets 237  Assessment and plan.  Hypoxia-challenging situation with patient's complex medical history with history of quadriplegia as well as pulmonary embolism-there is concern for an acute process here-I did have an extensive discussion with the patient as well as with his aunt who is acting as his responsible party-I talked with her via phone.  At this point patient has made it very clear he does not want to go to the ER presently--although I did express to him that this is concerning and with his history it would be  safer to go to ER he did express understanding-but stated adamantly  he did not want to go to the  ER and that if his condition got worse overnight he would go--I also spoke with his aunt who is aware of this and is in agreement  At this point we'll order oxygen-also will hold clonazepam until further notice he recently was started on this although I am not convinced this is causing the hypoxia.  Also monitor vital signs  pulse ox hourly overnight then every 4 hours starting in the  a.m.  Also will order a stat chest x-ray as well as EKG----will order blood work CBC metabolic panel.  Will hold Lasix for now as well-.  Again with patient's history this is a concerning situation although he does not appear to be clinically unstable now--- this will have to be monitored very closely and any change in status will need to go to the ER-this was discussed with the patient and his aunt as well and they are in agreement---.  Of note I did do serial exams on patient and he appeared to be clinically stable although quite fragile and vulnerable as stated above--we'll await the stat studies ordered with orders to call provider when results are available  [id number]-of note greater than 50 minutes spent assessing patient-reassessing patient and coordinating and formulating a plan of care--including speaking with Dr. Leanord Hawking via phone in regards to coordinating and formulating the above plan of care    t.

## 2013-06-03 NOTE — Progress Notes (Signed)
ANTIBIOTIC CONSULT NOTE - FOLLOW UP  Pharmacy Consult for vancomycin, azactam Indication: PNA  Allergies  Allergen Reactions  . Sulfa Antibiotics Anaphylaxis    Unknown   . Cefepime Other (See Comments)    Itching (pt states he can take)  . Xarelto [Rivaroxaban] Other (See Comments)    Acute Mental Status changes    Patient Measurements: Height: 5' 10.08" (178 cm) Weight: 207 lb 14.3 oz (94.3 kg) IBW/kg (Calculated) : 73.18  Vital Signs: Temp: 98.9 F (37.2 C) (12/07 0743) Temp src: Oral (12/07 0743) BP: 78/52 mmHg (12/07 0800) Pulse Rate: 106 (12/07 0800) Intake/Output from previous day: 12/06 0701 - 12/07 0700 In: 1060 [I.V.:460; IV Piggyback:600] Out: 2925 [Urine:2925] Intake/Output from this shift: Total I/O In: 20 [I.V.:20] Out: 125 [Urine:125]  Labs:  Recent Labs  06/01/13 0500 06/02/13 0904 06/03/13 0500  WBC 14.6* 10.0 13.0*  HGB 13.0 11.6* 12.7*  PLT 164 148* 176  CREATININE 0.29* 0.23* 0.29*   Estimated Creatinine Clearance: 162.9 ml/min (by C-G formula based on Cr of 0.29). No results found for this basename: VANCOTROUGH, VANCOPEAK, VANCORANDOM, GENTTROUGH, GENTPEAK, GENTRANDOM, TOBRATROUGH, TOBRAPEAK, TOBRARND, AMIKACINPEAK, AMIKACINTROU, AMIKACIN,  in the last 72 hours    Assessment: 27 yo male with MRSA PNA on zyvox (and azactam) to change to vancomycin. WBC= 13, tmax= 101.3, SCr= 0.29, CrCl > 100.  Goal of Therapy:  Vancomycin trough level 15-20 mcg/ml  Plan:  -Vancomycin 1750mg  IV load followed by 1000mg  IV q8h -Will check a vancomycin level at steady state  Harland German, Pharm D 06/03/2013 12:02 PM

## 2013-06-03 NOTE — Progress Notes (Addendum)
PULMONARY  / CRITICAL CARE MEDICINE  Name: Alan Sanford MRN: [id number] DOB: Feb 28, 1986    ADMISSION DATE:  06/19/2013 CONSULTATION DATE:  05/30/2013  REFERRING MD :  EDP PRIMARY SERVICE:  PCCM  CHIEF COMPLAINT:  Acute respiratory failure  BRIEF PATIENT DESCRIPTION: 27 yo C4 quadriplegicresident  SNF admitted with worsening dyspnea.  Chest imaging revealed complete R lung collapse.  Intubated for bronchoscopy.  SIGNIFICANT EVENTS / STUDIES:  12/04  Admitted with acute respiratory failure, failed BiPAP 12/04  CTA chest:  R lung collapse / occlusion of mainstem bronchus 12/04  Bronchoscopy >>> Copious amounts of purulent secretions aspirated from R lung 12/05  Extubated 12/06  Family ( girlfriend - medical POA) / patient discussion >>> Aggressive medical management, but DNI/DNR and palliation if wose  LINES / TUBES: OETT  12/04 >>> 12/05 OGT 12/04 >>> 12/05 R [place] midline - chronic Foley - chronic  CULTURES: 12/04 Blood >>> 12/04 Urine >>> multiple morphotypes  12/04 Respiratory >>> STAPH AUREUS >>>  ANTIBIOTICS: Aztreonam 12/04 >>> Zyvox 12/04 >>>  INTERVAL HISTORY:  No acute events overnight.  VITAL SIGNS: Temp:  [98.8 F (37.1 C)-101.3 F (38.5 C)] 98.9 F (37.2 C) (12/07 0743) Pulse Rate:  [101-118] 104 (12/07 0600) Resp:  [19-39] 20 (12/07 0600) BP: (63-119)/(32-62) 81/47 mmHg (12/07 0600) SpO2:  [88 %-99 %] 92 % (12/07 0600) FiO2 (%):  [100 %] 100 % (12/06 1633)  HEMODYNAMICS:   VENTILATOR SETTINGS: Vent Mode:  [-]  FiO2 (%):  [100 %] 100 % INTAKE / OUTPUT: Intake/Output     12/06 0701 - 12/07 0700 12/07 0701 - 12/08 0700   I.V. (mL/kg) 440 (4.7)    IV Piggyback 300    Total Intake(mL/kg) 740 (7.8)    Urine (mL/kg/hr) 2925 (1.3)    Total Output 2925     Net -2185            PHYSICAL EXAMINATION: General:  No distress Neuro:  Quadriplegia, sleepy but arouses to stimulation HEENT:  Moist membranes, oxygen mask on Cardiovascular:  Regular, no  murmurs Lungs:  Diminished breath sounds R side Abdomen:  Soft, bowel sounds present Musculoskeletal:  Contractures, anasarca  LABS: CBC  Recent Labs Lab 06/01/13 0500 06/02/13 0904 06/03/13 0500  WBC 14.6* 10.0 13.0*  HGB 13.0 11.6* 12.7*  HCT 38.0* 34.9* 37.5*  PLT 164 148* 176   Coag's  Recent Labs Lab 06/01/2013 0908  INR 1.10   BMET  Recent Labs Lab 06/01/13 0500 06/02/13 0904 06/03/13 0500  NA 137 139 136  K 3.3* 3.0* 3.5  CL 102 104 98  CO2 25 25 27   BUN 7 7 11   CREATININE 0.29* 0.23* 0.29*  GLUCOSE 99 75 80   Electrolytes  Recent Labs Lab 06/01/13 0500 06/02/13 0904 06/03/13 0500  CALCIUM 8.4 7.6* 8.5   Sepsis Markers  Recent Labs Lab 06/21/2013 0925  LATICACIDVEN 1.55   ABG  Recent Labs Lab 06/26/2013 0950  PHART 7.310*  PCO2ART 52.7*  PO2ART 72.0*   Liver Enzymes  Recent Labs Lab 06/14/2013 0908 06/01/13 0500  AST 54* 29  ALT 66* 42  ALKPHOS 81 66  BILITOT 0.3 0.5  ALBUMIN 3.1* 2.5*   Cardiac Enzymes  Recent Labs Lab 06/18/2013 0908  PROBNP 68.1   Glucose  Recent Labs Lab 06/02/13 0423 06/02/13 0711 06/02/13 1933 06/02/13 2350 06/03/13 0414 06/03/13 0719  GLUCAP 79 90 86 101* 86 84   CXR:  12/07 >>> Recurrent collapse R lung  ASSESSMENT / PLAN:  PULMONARY A:  HCAP.  R lung collapse secondary to mucus plug, recurrent.  Doubt effusion.  Acute on chronic respiratory failure. Difficult intubation. P:   Gaol SpO2>92 Supplemental oxygen PRN Albuterol / Atrovent Start Vest chest PT Cough assist / IPPB Patient again declined bronchoscopy / any invasive procedures, reconfirmed DNR/DNI  CARDIOVASCULAR A: Chronic hypotension. P:  Goal MAP>60  RENAL A:  Hypokalemia. P:   Trend BMP Chronic foley Lasix 40 daily K 40 x 1 NS 500 x 1  GASTROINTESTINAL A:  No active issues. P:   Diet  HEMATOLOGIC A:  Chronic anticoagulation for multiple VTE. P:  Trend CBC Preadmission Pradaxa  INFECTIOUS A:   HCAP. P:   Cultures and antibiotics as above  ENDOCRINE  A:  No active issues. P:   No intervention required  NEUROLOGIC A:  C4 quadriplegia. Bipolar disorder. P:   Preadmission Baclofen, Abilify, Seroquel, Cogentin, Klonopin, Neurontin, Valproic acid Hold Prozac as on Zyvox  If no aggressive treatment is desired would consider transfer back to nursing facility on Monday, while continuing abx / pulmonary hygiene.  Not clear if they would be able to take him on high FiO2.  Alternatively, floor transfer seems appropriate.  I have personally obtained history, examined patient, evaluated and interpreted laboratory and imaging results, reviewed medical records, formulated assessment / plan and placed orders.  Lonia Farber, MD Pulmonary and Critical Care Medicine [facility] Pager: [phone]  06/03/2013, 8:25 AM

## 2013-06-04 DIAGNOSIS — F319 Bipolar disorder, unspecified: Secondary | ICD-10-CM

## 2013-06-04 DIAGNOSIS — J9819 Other pulmonary collapse: Secondary | ICD-10-CM

## 2013-06-04 DIAGNOSIS — J988 Other specified respiratory disorders: Secondary | ICD-10-CM

## 2013-06-04 LAB — BASIC METABOLIC PANEL
Calcium: 8.4 mg/dL (ref 8.4–10.5)
Creatinine, Ser: 0.24 mg/dL — ABNORMAL LOW (ref 0.50–1.35)
GFR calc Af Amer: >90 mL/min (ref >90)
Potassium: 2.8 mEq/L — ABNORMAL LOW (ref 3.5–5.1)

## 2013-06-04 LAB — CBC
MCH: 33.1 pg (ref 26.0–34.0)
MCV: 97.3 fL (ref 78.0–100.0)
Platelets: 159 10*3/uL (ref 150–400)
RBC: 3.69 MIL/uL — ABNORMAL LOW (ref 4.22–5.81)
RDW: 13.5 % (ref 11.5–15.5)
WBC: 8.5 10*3/uL (ref 4.0–10.5)

## 2013-06-04 MED ORDER — POTASSIUM CHLORIDE 10 MEQ/50ML IV SOLN
INTRAVENOUS | Status: AC
  Administered 2013-06-04: 10 meq
  Filled 2013-06-04: qty 50

## 2013-06-04 MED ORDER — ALBUTEROL SULFATE (5 MG/ML) 0.5% IN NEBU
2.5000 mg | INHALATION_SOLUTION | Freq: Four times a day (QID) | RESPIRATORY_TRACT | Status: DC
  Administered 2013-06-04 – 2013-06-14 (×39): 2.5 mg via RESPIRATORY_TRACT
  Filled 2013-06-04 (×36): qty 0.5

## 2013-06-04 MED ORDER — POTASSIUM CHLORIDE 20 MEQ/15ML (10%) PO LIQD
60.0000 meq | Freq: Once | ORAL | Status: AC
  Administered 2013-06-04: 60 meq via ORAL
  Filled 2013-06-04: qty 45

## 2013-06-04 MED ORDER — VALPROIC ACID 250 MG PO CAPS
750.0000 mg | ORAL_CAPSULE | Freq: Two times a day (BID) | ORAL | Status: DC
  Administered 2013-06-04 – 2013-06-12 (×16): 750 mg via ORAL
  Filled 2013-06-04 (×19): qty 3

## 2013-06-04 MED ORDER — POTASSIUM CHLORIDE 10 MEQ/100ML IV SOLN
10.0000 meq | INTRAVENOUS | Status: AC
  Administered 2013-06-04 (×2): 10 meq via INTRAVENOUS
  Filled 2013-06-04 (×4): qty 100

## 2013-06-04 MED ORDER — SODIUM CHLORIDE 0.9 % IV SOLN
INTRAVENOUS | Status: DC
  Administered 2013-06-04 – 2013-06-07 (×2): 20 mL/h via INTRAVENOUS
  Administered 2013-06-09 – 2013-06-12 (×3): via INTRAVENOUS

## 2013-06-04 NOTE — Progress Notes (Signed)
UR Completed.  Alan Sanford Jane [phone] 07/25/2012  

## 2013-06-04 NOTE — Progress Notes (Signed)
IBBP on hold at this time due to SpO2 in the low 90s. Breathing tx and CPT via vest performed. SpO2 dropped into the 80s but returned to the 90s within a couple minutes. Pt vitals WNL post txs.

## 2013-06-04 NOTE — Progress Notes (Signed)
Patient refused bath using foul language and threatening RN, pt. alert and oriented, no bath given per patient refusal.   Corliss Skains RN

## 2013-06-04 NOTE — Progress Notes (Signed)
eLink Physician-Brief Progress Note Patient Name: Alan Sanford DOB: [birth year] MRN: [id number]  Date of Service  06/04/2013   HPI/Events of Note     eICU Interventions  Hypokalemia, repleted    Intervention Category Minor Interventions: Electrolytes abnormality - evaluation and management  MCQUAID, DOUGLAS 06/04/2013, 6:23 AM

## 2013-06-04 NOTE — Progress Notes (Signed)
PULMONARY  / CRITICAL CARE MEDICINE  Name: Alan Sanford MRN: [id number] DOB: [birth year]    ADMISSION DATE:  06/14/2013 CONSULTATION DATE:  05/30/2013  REFERRING MD :  EDP PRIMARY SERVICE:  PCCM  CHIEF COMPLAINT:  Acute respiratory failure  BRIEF PATIENT DESCRIPTION: 27 yo C4 quadriplegicresident  SNF admitted with worsening dyspnea.  Chest imaging revealed complete R lung collapse.  Intubated for bronchoscopy.  SIGNIFICANT EVENTS / STUDIES:  12/04  Admitted with acute respiratory failure, failed BiPAP 12/04  CTA chest:  R lung collapse / occlusion of mainstem bronchus 12/04  Bronchoscopy >>> Copious amounts of purulent secretions aspirated from R lung 12/05  Extubated 12/06  Family ( girlfriend - medical POA) / patient discussion >>> Aggressive medical management, but DNI/DNR and palliation if wose  LINES / TUBES: ETT  12/04 >>> 12/05 R [place] midline - chronic Foley - chronic  CULTURES: 12/04 Blood >> NEG 12/04 Urine >>> multiple morphotypes  12/04 Respiratory >> MRSA  ANTIBIOTICS: Aztreonam 12/04 >>  Vanc 12/04 >>   INTERVAL HISTORY:   No distress but requiring increased O2  VITAL SIGNS: Temp:  [97.4 F (36.3 C)-98.8 F (37.1 C)] 98.2 F (36.8 C) (12/08 1140) Pulse Rate:  [84-109] 99 (12/08 1422) Resp:  [15-48] 30 (12/08 1422) BP: (71-134)/(46-100) 100/83 mmHg (12/08 1412) SpO2:  [83 %-100 %] 90 % (12/08 1422) FiO2 (%):  [50 %-100 %] 100 % (12/08 1422)  HEMODYNAMICS:   VENTILATOR SETTINGS: Vent Mode:  [-] BIPAP FiO2 (%):  [50 %-100 %] 100 % Set Rate:  [16 bmp] 16 bmp INTAKE / OUTPUT: Intake/Output     12/07 0701 - 12/08 0700 12/08 0701 - 12/09 0700   P.O.  310   I.V. (mL/kg) 240 (2.5) 100 (1.1)   IV Piggyback 800 400   Total Intake(mL/kg) 1040 (11) 810 (8.6)   Urine (mL/kg/hr) 2125 (0.9) 1475 (1.8)   Stool 101 (0)    Total Output 2226 1475   Net -1186 -665          PHYSICAL EXAMINATION: General:  No distress Neuro:  Quadriplegia, sleepy but  arouses to stimulation HEENT:  Moist membranes, oxygen mask on Cardiovascular:  Regular, no murmurs Lungs:  Diminished breath sounds R side Abdomen:  Soft, bowel sounds present Musculoskeletal:  Contractures, anasarca  LABS:  I have reviewed all of today's lab results. Relevant abnormalities are discussed in the A/P section  CXR:  NNF  ASSESSMENT / PLAN:  PULMONARY A:  Acute on chronic resp failure Pneumonia Recurrent lung collapse  Mucus plug, recurrent.   P:   Cont chest perc, NTS PRN, nebs and mucolytics Transfer to SDU. Remains on PCCM service  CARDIOVASCULAR A: Chronic hypotension. P:  Monitor No rx unless symptomatic  RENAL A:  Hypokalemia. Chronic Foley P:   Monitor BMET intermittently Correct electrolytes as indicated   GASTROINTESTINAL A:  No active issues. P:   Cont diet  HEMATOLOGIC A:  Chronic anticoagulation for multiple VTE. P:  Trend CBC Preadmission Pradaxa  INFECTIOUS A:  MRSA PNA P:   Cultures and antibiotics as above  ENDOCRINE  A:  No active issues. P:   No intervention required  NEUROLOGIC A:  C4 quadriplegia. Bipolar disorder. P:   Preadmission Baclofen, Abilify, Seroquel, Cogentin, Klonopin, Neurontin, Valproic acid Hold Prozac as on Zyvox   Transfer to SDU 12/08 and keep on PCCM service  Billy Fischer, MD Pulmonary and Critical Care Medicine [facility] Pager: [phone]  06/04/2013, 3:46 PM

## 2013-06-05 ENCOUNTER — Inpatient Hospital Stay (HOSPITAL_COMMUNITY): Payer: Federal, State, Local not specified - PPO

## 2013-06-05 DIAGNOSIS — G8250 Quadriplegia, unspecified: Secondary | ICD-10-CM

## 2013-06-05 LAB — BASIC METABOLIC PANEL
BUN: 7 mg/dL (ref 6–23)
Calcium: 8.5 mg/dL (ref 8.4–10.5)
Creatinine, Ser: 0.22 mg/dL — ABNORMAL LOW (ref 0.50–1.35)
GFR calc Af Amer: >90 mL/min (ref >90)
GFR calc non Af Amer: >90 mL/min (ref >90)
Glucose, Bld: 83 mg/dL (ref 70–99)
Potassium: 3.1 mEq/L — ABNORMAL LOW (ref 3.5–5.1)
Sodium: 140 mEq/L (ref 135–145)

## 2013-06-05 LAB — GLUCOSE, CAPILLARY: Glucose-Capillary: 91 mg/dL (ref 70–99)

## 2013-06-05 LAB — VANCOMYCIN, TROUGH: Vancomycin Tr: 12.7 ug/mL (ref 10.0–20.0)

## 2013-06-05 MED ORDER — VANCOMYCIN HCL 10 G IV SOLR
1250.0000 mg | Freq: Three times a day (TID) | INTRAVENOUS | Status: DC
  Administered 2013-06-05 – 2013-06-13 (×22): 1250 mg via INTRAVENOUS
  Filled 2013-06-05 (×30): qty 1250

## 2013-06-05 MED ORDER — POTASSIUM CHLORIDE CRYS ER 20 MEQ PO TBCR
30.0000 meq | EXTENDED_RELEASE_TABLET | ORAL | Status: AC
  Administered 2013-06-05 (×2): 30 meq via ORAL
  Filled 2013-06-05 (×4): qty 1

## 2013-06-05 MED ORDER — ENSURE COMPLETE PO LIQD
237.0000 mL | Freq: Two times a day (BID) | ORAL | Status: DC
  Administered 2013-06-05 – 2013-06-11 (×6): 237 mL via ORAL

## 2013-06-05 NOTE — Clinical Social Work Note (Signed)
CSW reviewed chart and continues to be available for assistance and support.  Vickii Penna, LCSWA [phone]  Clinical Social Work

## 2013-06-05 NOTE — Progress Notes (Signed)
[facility] ADULT ICU REPLACEMENT PROTOCOL FOR AM LAB REPLACEMENT ONLY  The patient does apply for the [facility] Adult ICU Electrolyte Replacment Protocol based on the criteria listed below:   1. Is GFR >/= 40 ml/min? yes  Patient's GFR today is >90 2. Is urine output >/= 0.5 ml/kg/hr for the last 6 hours? yes Patient's UOP is 0.7 ml/kg/hr 3. Is BUN < 60 mg/dL? yes  Patient's BUN today is 7 4. Abnormal electrolyte(s):Potassium 5. Ordered repletion with: Potassium per Protocol  Ashlan Dignan P 06/05/2013 5:49 AM

## 2013-06-05 NOTE — Progress Notes (Signed)
IPPB wasn't performed at this time due to the patients SpO2  dropped to to  89 on attempt.  Chest vest being performed but the pt SpO2 also drops to 90% during treatment. Pt was able to complete the chest vest and doesn't show signs of distress post treatment and SpO2 is 92 %.

## 2013-06-05 NOTE — Progress Notes (Signed)
ANTIBIOTIC CONSULT NOTE - FOLLOW UP  Pharmacy Consult for vancomycin, azactam Indication: PNA  Allergies  Allergen Reactions  . Sulfa Antibiotics Anaphylaxis    Unknown   . Cefepime Other (See Comments)    Itching (pt states he can take)  . Xarelto [Rivaroxaban] Other (See Comments)    Acute Mental Status changes    Patient Measurements: Height: 5' 10.08" (178 cm) Weight: 207 lb 14.3 oz (94.3 kg) IBW/kg (Calculated) : 73.18  Vital Signs: Temp: 97.7 F (36.5 C) (12/09 1226) Temp src: Oral (12/09 1226) BP: 117/67 mmHg (12/09 1200) Pulse Rate: 62 (12/09 1200) Intake/Output from previous day: 12/08 0701 - 12/09 0700 In: 1510 [P.O.:310; I.V.:400; IV Piggyback:800] Out: 2745 [Urine:2695; Stool:50] Intake/Output from this shift: Total I/O In: 330 [P.O.:250; I.V.:80] Out: 150 [Urine:150]  Labs:  Recent Labs  06/03/13 0500 06/04/13 0500 06/05/13 0447  WBC 13.0* 8.5  --   HGB 12.7* 12.2*  --   PLT 176 159  --   CREATININE 0.29* 0.24* 0.22*   Estimated Creatinine Clearance: 162.9 ml/min (by C-G formula based on Cr of 0.22).  Recent Labs  06/05/13 1230  VANCOTROUGH 12.7    Assessment: 27 yo male with MRSA PNA continues on vancomycin + aztreonam. A vancomycin trough was checked today and is slightly low at 12.7. Pt is afebrile and WBC is WNL.   Zyvox 12/4 >>12/7 Azactam 12/4>> Vanc 12/7>>  12/4 urine cx- mult types 12/4 blood cx x 2- ngtd 12/4 resp - MRSA  Goal of Therapy:  Vancomycin trough level 15-20 mcg/ml  Plan:  1. Change vancomycin to 1250mg  IV Q8H 2. F/u renal fxn, C&S, clinical status and trough at [facility], PharmD, BCPS Pager # [phone] 06/05/2013 2:08 PM

## 2013-06-05 NOTE — Progress Notes (Signed)
PULMONARY  / CRITICAL CARE MEDICINE  Name: Alan Sanford MRN: [id number] DOB: 10-01-1985    ADMISSION DATE:  06/08/2013 CONSULTATION DATE:  06/12/2013  REFERRING MD :  EDP PRIMARY SERVICE:  PCCM  CHIEF COMPLAINT:  Acute respiratory failure  BRIEF PATIENT DESCRIPTION: 27 yo C4 quadriplegicresident  SNF admitted with worsening dyspnea.  Chest imaging revealed complete R lung collapse.  Intubated for bronchoscopy.  SIGNIFICANT EVENTS / STUDIES:  12/04  Admitted with acute respiratory failure, failed BiPAP 12/04  CTA chest:  R lung collapse / occlusion of mainstem bronchus 12/04  Bronchoscopy >>> Copious amounts of purulent secretions aspirated from R lung 12/05  Extubated 12/06  Family ( girlfriend - medical POA) / patient discussion >>> Aggressive medical management, but DNI/DNR and palliation if wose  LINES / TUBES: ETT  12/04 >>> 12/05 R [place] midline - chronic Foley - chronic  CULTURES: 12/04 Blood >> NEG 12/04 Urine >>> multiple morphotypes  12/04 Respiratory >> MRSA  ANTIBIOTICS: Aztreonam 12/04 >>  Vanc 12/04 >>   INTERVAL HISTORY:   Little change. Still req 100% NRB  VITAL SIGNS: Temp:  [97.7 F (36.5 C)-98.5 F (36.9 C)] 97.7 F (36.5 C) (12/09 1226) Pulse Rate:  [62-98] 62 (12/09 1200) Resp:  [11-24] 14 (12/09 1200) BP: (74-117)/(14-94) 117/67 mmHg (12/09 1200) SpO2:  [82 %-100 %] 100 % (12/09 1200) FiO2 (%):  [100 %] 100 % (12/09 1200)  HEMODYNAMICS:   VENTILATOR SETTINGS: Vent Mode:  [-]  FiO2 (%):  [100 %] 100 % INTAKE / OUTPUT: Intake/Output     12/08 0701 - 12/09 0700 12/09 0701 - 12/10 0700   P.O. 310 250   I.V. (mL/kg) 400 (4.2) 80 (0.8)   IV Piggyback 800    Total Intake(mL/kg) 1510 (16) 330 (3.5)   Urine (mL/kg/hr) 2695 (1.2) 375 (0.4)   Stool 50 (0) 150 (0.2)   Total Output 2745 525   Net -1235 -195          PHYSICAL EXAMINATION: General:  No distress Neuro:  Quadriplegia, sleepy but arouses to stimulation HEENT:  Moist membranes,  oxygen mask on Cardiovascular:  Regular, no murmurs Lungs:  Diminished breath sounds R side Abdomen:  Soft, bowel sounds present Musculoskeletal:  Contractures, anasarca  LABS:  I have reviewed all of today's lab results. Relevant abnormalities are discussed in the A/P section  CXR:  RLL atx/eff  ASSESSMENT / PLAN:  PULMONARY A:  Acute on chronic resp failure Pneumonia Recurrent lung collapse  Mucus plug, recurrent.   P:   Cont chest perc, NTS PRN, nebs and mucolytics Transfer to SDU. Remains on PCCM service  CARDIOVASCULAR A: Chronic hypotension. P:  Monitor No rx unless symptomatic  RENAL A:  Hypokalemia. Chronic Foley P:   Monitor BMET intermittently Correct electrolytes as indicated   GASTROINTESTINAL A:  No active issues. P:   Cont diet  HEMATOLOGIC A:  Chronic anticoagulation for multiple VTE. P:  Trend CBC Preadmission Pradaxa  INFECTIOUS A:  MRSA PNA P:   Cultures and antibiotics as above  ENDOCRINE  A:  No active issues. P:   No intervention required  NEUROLOGIC A:  C4 quadriplegia. Bipolar disorder. P:   Preadmission Baclofen, Abilify, Seroquel, Cogentin, Klonopin, Neurontin, Valproic acid Hold Prozac as on Zyvox   Transfer to SDU ordered 12/08 and keep on PCCM service  Billy Fischer, MD Pulmonary and Critical Care Medicine [facility] Pager: [phone]  06/05/2013, 4:09 PM

## 2013-06-05 NOTE — Progress Notes (Signed)
INITIAL NUTRITION ASSESSMENT  DOCUMENTATION CODES Per approved criteria  -Not Applicable   INTERVENTION:  Ensure Complete PO BID, each supplement provides 350 kcal and 13 grams of protein.  NUTRITION DIAGNOSIS: Increased nutrient needs related to multiple wounds as evidenced by estimated nutrition needs.   Goal: Intake to meet >90% of estimated nutrition needs.  Monitor:  PO intake, labs, weight trend.  Reason for Assessment: Low Braden  27 y.o. male  Admitting Dx: Acute respiratory failure  ASSESSMENT: Patient is a 27 yo C4 quadriplegic resident af a SNF admitted with worsening dyspnea. Chest imaging revealed complete R lung collapse. Intubated for bronchoscopy. Extubated on .  Patient now receiving Full Liquid diet. PO intake is poor. Consumed none of breakfast today. Patient with increased nutrient needs to support wound healing.   Nutrition Focused Physical Exam (difficult to assess due to contractures and anasarca):  Subcutaneous Fat:  Orbital Region: WNL Upper Arm Region: WNL Thoracic and Lumbar Region: NA  Muscle:  Temple Region: WNL Clavicle Bone Region: WNL Clavicle and Acromion Bone Region: WNL Scapular Bone Region: NA Dorsal Hand: WNL Patellar Region: NA Anterior Thigh Region: NA Posterior Calf Region: NA  Edema: anasarca   Height: Ht Readings from Last 1 Encounters:  2013/06/04 5' 10.08" (1.78 m)    Weight: Wt Readings from Last 1 Encounters:  06/04/2013 207 lb 14.3 oz (94.3 kg)    Ideal Body Weight: 75.5 kg  % Ideal Body Weight: 125%  Wt Readings from Last 10 Encounters:  Jun 04, 2013 207 lb 14.3 oz (94.3 kg)  02/15/13 182 lb (82.555 kg)    Usual Body Weight: 182 lb  % Usual Body Weight: 114% (weight elevated with anasarca)  BMI:  Body mass index is 29.76 kg/(m^2).  Estimated Nutritional Needs: Kcal: 2100-2300 Protein: 115-130 gm Fluid: 2.2-2.4 L  Skin: stage II pressure ulcer on sacrum; deep tissue injury to heel; stage I pressure  ulcer to elbow  Diet Order: Full Liquid  EDUCATION NEEDS: -Education not appropriate at this time   Intake/Output Summary (Last 24 hours) at 06/05/13 1039 Last data filed at 06/05/13 1000  Gross per 24 hour  Intake   1420 ml  Output   2320 ml  Net   -900 ml    Last BM: 12/9   Labs:   Recent Labs Lab 06/03/13 0500 06/04/13 0500 06/05/13 0447  NA 136 136 140  K 3.5 2.8* 3.1*  CL 98 99 101  CO2 27 28 28   BUN 11 9 7   CREATININE 0.29* 0.24* 0.22*  CALCIUM 8.5 8.4 8.5  GLUCOSE 80 81 83    CBG (last 3)   Recent Labs  06/03/13 0719 06/03/13 1105 06/03/13 1521  GLUCAP 84 132* 92    Scheduled Meds: . acetylcysteine  3 mL Nebulization QID  . albuterol  2.5 mg Nebulization Q6H  . antiseptic oral rinse  15 mL Mouth Rinse QID  . ARIPiprazole  10 mg Oral Daily  . aztreonam  1 g Intravenous Q8H  . baclofen  10 mg Oral TID  . benztropine  0.5 mg Oral BID  . chlorhexidine  15 mL Mouth Rinse BID  . clonazePAM  2 mg Oral BID  . dabigatran  150 mg Oral BID  . FLUoxetine  40 mg Oral Daily  . gabapentin  800 mg Oral TID  . oxybutynin  5 mg Oral BID  . QUEtiapine  400 mg Oral QHS  . valproic acid  750 mg Oral BID  . vancomycin  1,000 mg  Intravenous Q8H    Continuous Infusions: . sodium chloride 20 mL/hr (06/04/13 0700)    Past Medical History  Diagnosis Date  . Quadriplegia, C1-C4 complete   . Bipolar 1 disorder   . Respiratory failure, chronic   . GERD (gastroesophageal reflux disease)   . Pressure ulcer   . Anxiety   . Depression     Past Surgical History  Procedure Laterality Date  . Colostomy      Joaquin Courts, RD, LDN, CNSC Pager [phone] After Hours Pager [phone]

## 2013-06-06 DIAGNOSIS — Z515 Encounter for palliative care: Secondary | ICD-10-CM

## 2013-06-06 DIAGNOSIS — R4182 Altered mental status, unspecified: Secondary | ICD-10-CM

## 2013-06-06 LAB — BASIC METABOLIC PANEL
Calcium: 8.5 mg/dL (ref 8.4–10.5)
Creatinine, Ser: 0.2 mg/dL — ABNORMAL LOW (ref 0.50–1.35)
GFR calc non Af Amer: >90 mL/min (ref >90)
Glucose, Bld: 81 mg/dL (ref 70–99)
Sodium: 140 mEq/L (ref 135–145)

## 2013-06-06 LAB — CULTURE, BLOOD (ROUTINE X 2): Culture: NO GROWTH

## 2013-06-06 MED ORDER — OXYCODONE HCL 5 MG PO TABS
5.0000 mg | ORAL_TABLET | ORAL | Status: DC | PRN
  Administered 2013-06-06 – 2013-06-10 (×4): 5 mg via ORAL
  Filled 2013-06-06 (×5): qty 1

## 2013-06-06 MED ORDER — POTASSIUM CHLORIDE CRYS ER 20 MEQ PO TBCR
40.0000 meq | EXTENDED_RELEASE_TABLET | Freq: Once | ORAL | Status: AC
  Administered 2013-06-06: 40 meq via ORAL
  Filled 2013-06-06: qty 2

## 2013-06-06 MED ORDER — LEVOFLOXACIN 750 MG PO TABS
750.0000 mg | ORAL_TABLET | Freq: Every day | ORAL | Status: DC
  Administered 2013-06-06 – 2013-06-11 (×6): 750 mg via ORAL
  Filled 2013-06-06 (×6): qty 1

## 2013-06-06 NOTE — Progress Notes (Signed)
PULMONARY  / CRITICAL CARE MEDICINE  Name: Alan Sanford MRN: [id number] DOB: Jan 21, 1986    ADMISSION DATE:  2013/06/22 CONSULTATION DATE:  22-Jun-2013  REFERRING MD :  EDP PRIMARY SERVICE:  PCCM  CHIEF COMPLAINT:  Acute respiratory failure  BRIEF PATIENT DESCRIPTION: 27 yo C4 quadriplegicresident  SNF admitted with worsening dyspnea.  Chest imaging revealed complete R lung collapse.  Intubated for bronchoscopy.  SIGNIFICANT EVENTS / STUDIES:  12/04  Admitted with acute respiratory failure, failed BiPAP 12/04  CTA chest:  R lung collapse / occlusion of mainstem bronchus 12/04  Bronchoscopy >>> Copious amounts of purulent secretions aspirated from R lung 12/05  Extubated 12/06  Family ( girlfriend - medical POA) / patient discussion > Aggressive medical management, but DNI/DNR and palliation if wose  LINES / TUBES: ETT  12/04 >>> 12/05 R [place] midline - chronic Foley - chronic  CULTURES: 12/04 Blood >> NEG 12/04 Urine >>> multiple morphotypes  12/04 Respiratory >> MRSA  ANTIBIOTICS: Aztreonam 12/04 >> 12/10 Vanc 12/04 >>  Levoflox 12/10 >>    INTERVAL HISTORY:   No new complaints. Still req 100% NRB. desats readily. Expresses desire to forgo IPPB and NPPV. Expresses desire for palliative care consult  VITAL SIGNS: Temp:  [97.3 F (36.3 C)-98.6 F (37 C)] 98 F (36.7 C) (12/10 1230) Pulse Rate:  [81-117] 115 (12/10 1300) Resp:  [16-36] 30 (12/10 1300) BP: (73-106)/(45-76) 91/56 mmHg (12/10 1200) SpO2:  [68 %-98 %] 71 % (12/10 1323) FiO2 (%):  [100 %] 100 % (12/10 1334)  HEMODYNAMICS:   VENTILATOR SETTINGS: Vent Mode:  [-] BIPAP FiO2 (%):  [100 %] 100 % Set Rate:  [16 bmp] 16 bmp INTAKE / OUTPUT: Intake/Output     12/09 0701 - 12/10 0700 12/10 0701 - 12/11 0700   P.O. 250    I.V. (mL/kg) 480 (5.1) 80 (0.8)   IV Piggyback 500    Total Intake(mL/kg) 1230 (13) 80 (0.8)   Urine (mL/kg/hr) 1150 (0.5) 310 (0.4)   Stool 150 (0.1)    Total Output 1300 310   Net -70  -230          PHYSICAL EXAMINATION: General:  No distress Neuro:  Quadriplegia, sleepy but arouses to stimulation HEENT:  Moist membranes, oxygen mask on Cardiovascular:  Regular, no murmurs Lungs:  Diminished breath sounds on L now, rhonchi on R Abdomen:  Soft, bowel sounds present Musculoskeletal:  Contractures, anasarca  LABS:  I have reviewed all of today's lab results. Relevant abnormalities are discussed in the A/P section  CXR:  NNF  ASSESSMENT / PLAN:  PULMONARY A:  Acute on chronic resp failure Pneumonia Recurrent lung collapse  Mucus plug, recurrent.   P:   Cont chest perc, NTS PRN, nebs and mucolytics Transfer to SDU ordered. Remains on PCCM service  CARDIOVASCULAR A: Chronic hypotension. P:  Monitor No rx unless symptomatic  RENAL A:  Hypokalemia. Chronic Foley P:   Monitor BMET intermittently Correct electrolytes as indicated   GASTROINTESTINAL A:  No active issues. P:   Cont diet  HEMATOLOGIC A:  Chronic anticoagulation for multiple VTE. P:  Trend CBC Preadmission Pradaxa  INFECTIOUS A:  MRSA PNA P:   Cultures and antibiotics as above  ENDOCRINE  A:  No active issues. P:   No intervention required  NEUROLOGIC A:  C4 quadriplegia. Bipolar disorder. P:   Preadmission Baclofen, Abilify, Seroquel, Cogentin, Klonopin, Neurontin, Valproic acid Hold Prozac as on Zyvox   Transfer to SDU ordered 12/08 and keep  on PCCM service Palliative Care consult requested 12/10 for goals of care   Billy Fischer, MD Pulmonary and Critical Care Medicine [facility] Pager: [phone]  06/06/2013, 2:36 PM

## 2013-06-06 NOTE — Progress Notes (Signed)
Called to place pt on BIPAP per pt's request. Asked and explained what BIPAP was to pt and he stated "I do not wear that." Pt was asked numerous times by RT and RN if he wanted to wear BIPAP. Pt became increasingly agitated and still refused. Pt was left on NRB.

## 2013-06-06 NOTE — Progress Notes (Signed)
Respiratory therapy note-Patient very upset at this time, unable to communicate well and stating that we are not listening to what he needs. RN notified.

## 2013-06-06 NOTE — Consult Note (Addendum)
Patient Alan Sanford      DOB: 10-15-1985      GUY:[id number]  Summary of Goals of care; full note to follow:   Met with patient and spoke with patient's identified "step mom"  Luster Landsberg [phone]  Reviewed the chart noted conversation between Dr. Delilah Shan regarding patient expressing that he would not want long term intubation and his understanding of CPR and resuscitation.  At this time,  The patient is intermittently confused but is able to reiterate the same message- I do not want people convincing me to live on machines by telling me it will just be for a little while.  He is consistent in his message to not want CPR or Vent support and he has been refusing Bipap.  I asked him if our team could bring his family unit together to help them understand his wishes and assured him that I would not be trying convince him against his wishes to change his mind about being on a breathing machine.  The patient is documented as being of clear mind when he originally expressed his wishes for DNR/DNI and his step mother affirms that this is what he has said in the past . Of note , the patient's girlfriend has asserted her role as POA but subsequently has expressed this is not so and that they are "estranged" per report from nursing.  The patient himself has given me permission to discuss his care with his "father and mother"  Marina Goodell and Luster Landsberg .  They will be coming to meet with me at 630 pm tonight.   For now the patient is comfortable on nonrebreather, although his sats are low and he is intermittently confused.   Total time: 215 pm- 300 pm  Monserratt Knezevic L. Ladona Ridgel, MD MBA The Palliative Medicine Team at [facility] Phone: [phone] Pager: [phone]     Addendum:  Met with patient , step mom, and adoptive father.  Patient remains intermittently but confirmed current communications are consistent with conversations that his step mother has had with him.   Family respecting patient's current decision  and choice, but would like to support over night to see if Mansa remains consistent in his decision making.  While Exzavier currently is confused the message that he continues to deliver is compatible with the message given when he had full capacity.  For tonight, no escalation in care.  Continue with current supportive treatments- nebs, antibiotics, IVF.  Patient may refuse treatment per family they will support this.  Will reassess in the AM .  We gave Tanay the option for full comfort and use of Hospice.  He faltered in his response when faced with making this stating "I am only 27 yrs old, I may need some guidance".  Evidently, his mother died at the age of 28 yrs old from leukemia after giving birth to Kymari's brother.  Will revisit full comfort options in the AM which will give step mom time to digest and work with Criss.  Luster Landsberg is Water quality scientist POA and adoptive dad has financial POA.  Full consult to follow.  Total time : 625 pm- 725 pm  Digna Countess L. Ladona Ridgel, MD MBA The Palliative Medicine Team at [facility] Phone: [phone] Pager: [phone]

## 2013-06-06 NOTE — Progress Notes (Signed)
This RN continues to find Patient with NRB mask pushed to the side as he turns his head when he sleeps and oxygen saturations are low in 70's. Attempt to add Nasal Cannula oxygen as well which does not help much and continues to irritate the patient.  I have attempted to address Pt needs and he resists reporting he does not want to wear the mask and wants his parents to come and visit him.  I was unable to calm patient offering support and attempted contact with Abbie Sons who I was informed was his POA/girlfriend.  I requested support from her in meeting the Patients needs and she described the Patients baseline as an "Asshole" and that if she was here he would just be angry at her as well. I explained that he was asking for his parents and even thought they were in the hallway and she began to ask if ammonia levels had been checked.  I explained that his oxygen level had been low as he was not maintaining his saturations even though he was on BiPap all night long.  Noreene Larsson referred me to call his step mother Luster Landsberg who she called his real POA and explained she was planning to visit later today and that she could not come at this time. I explained my intentions to to seek family support for the Patient and she again referred me to [facility] and gave me Phone # [phone] to call her at.  I explained I would continue to support Patient as best I could and inform him his mother would be visiting later today.  Dr. Sung Amabile made aware of Pt status and desire to involve palliative care in his plan.   Jacqulyn Cane RN, BSN, CCRN

## 2013-06-06 NOTE — Consult Note (Signed)
Patient Alan Sanford      DOB: [birth year]      YNW:[id number]     Consult Note from the Palliative Medicine Team at [facility]    Consult Requested by: Dr. Sung Amabile    PCP: [name], MD Reason for Consultation: GOC     Phone Number:[phone]  Assessment of patients Current state: 27 yr old s/p traumatic injury resulting in quadriparesis after a suicide attempt admitted with respiratory failure , now with encephalopathy related to respiratory failure.  Patient has clearly express his wishes to not have CPR or intubation during more coherent moments.  His family met with [place] an confirmed these wishes.  At this time he is expressing some hesitancy regarding long term choice for hospice care, but agrees to continue treatment for cure with in the context of his wishes to not be intubated.  We agreed to remeet in the am and parallel cure with comfort.  If he passes away despite curative treatment he is accepting of this but hesitant to commit to full comfort care at this moment which is appropriate in light of his level of confusion.   Goals of Care: 1.  Code Status: DnR/ DNI  Offer bipap if distressed and if he refuses honor his wishes.   2. Scope of Treatment: Continue supportive care with antibiotics, ivf, comfort feeding .  Honor wishes to forgo intubation and cpr,   4. Disposition: to be determined   3. Symptom Management:   1. Anxiety/Agitation: continue klonipin, will ask pt if psych could help with his meds 2. Pain: agree with oxycodone.  May need to add Iv opiate if  He develops worsened work of breathing. 3. Bowel Regimen: monitor , do not hesitate to use rectal stim with dulcolax if no bowel movement in next 24 hrs 4. Delirium: with encephalopathy related to infection, respiratory failure .  Continue to treat 5. Fever: tylenol prn  4. Psychosocial:  History of bipolar disorder with suicide attempt in 2011, history of polysubtance abuse, lost his mother at a  young age. Has loving adoptive parents.  Significant other Jill involved.  5. Spiritual:  Not fully addressed due to poor cognitive function        Patient Documents Completed or Given: Document Given Completed  Advanced Directives Pkt    MOST    DNR    Gone from My Sight    Hard Choices      Brief HPI: 27 yr old white male with quadriparesis admitted with respiratory failure. S/p bronch for collapsed lung.  We were asked to assist with goals of care as he continues to decline.   ROS: unable to fully assess due to somnolence, lack of focus    PMH:  Past Medical History  Diagnosis Date  . Quadriplegia, C1-C4 complete   . Bipolar 1 disorder   . Respiratory failure, chronic   . GERD (gastroesophageal reflux disease)   . Pressure ulcer   . Anxiety   . Depression      PSH: Past Surgical History  Procedure Laterality Date  . Colostomy     I have reviewed the FH and SH and  If appropriate update it with new information. Allergies  Allergen Reactions  . Sulfa Antibiotics Anaphylaxis    Unknown   . Cefepime Other (See Comments)    Itching (pt states he can take)  . Xarelto [Rivaroxaban] Other (See Comments)    Acute Mental Status changes   Scheduled Meds: . albuterol  2.5  mg Nebulization Q6H  . antiseptic oral rinse  15 mL Mouth Rinse QID  . ARIPiprazole  10 mg Oral Daily  . baclofen  10 mg Oral TID  . benztropine  0.5 mg Oral BID  . chlorhexidine  15 mL Mouth Rinse BID  . clonazePAM  2 mg Oral BID  . dabigatran  150 mg Oral BID  . feeding supplement (ENSURE COMPLETE)  237 mL Oral BID BM  . FLUoxetine  40 mg Oral Daily  . gabapentin  800 mg Oral TID  . levofloxacin  750 mg Oral Daily  . oxybutynin  5 mg Oral BID  . QUEtiapine  400 mg Oral QHS  . valproic acid  750 mg Oral BID  . vancomycin  1,250 mg Intravenous Q8H   Continuous Infusions: . sodium chloride 20 mL/hr (06/04/13 0700)   PRN Meds:.oxyCODONE    BP 86/50  Pulse 99  Temp(Src) 98 F  (36.7 C) (Oral)  Resp 18  Ht 5' 10.08" (1.78 m)  Wt 94.3 kg (207 lb 14.3 oz)  BMI 29.76 kg/m2  SpO2 91%   PPS: 20%   Intake/Output Summary (Last 24 hours) at 06/06/13 2238 Last data filed at 06/06/13 1800  Gross per 24 hour  Intake   1150 ml  Output    610 ml  Net    540 ml   LBM: 12/7                     Physical Exam:  General: Intermittently confused but at times rallies to talk HEENT: PERRL, EOMI, anciteric , mmm Chest:   Decreased but clear, no rhonchi, rales or wheezes minimal air movement CVS: regular , s1, s2 Abdomen:obese, soft , not tender Ext: contracted, quadraparesis Neuro:intermitently confused  Labs: CBC    Component Value Date/Time   WBC 8.5 06/04/2013 0500   RBC 3.69* 06/04/2013 0500   HGB 12.2* 06/04/2013 0500   HCT 35.9* 06/04/2013 0500   PLT 159 06/04/2013 0500   MCV 97.3 06/04/2013 0500   MCH 33.1 06/04/2013 0500   MCHC 34.0 06/04/2013 0500   RDW 13.5 06/04/2013 0500   LYMPHSABS 3.1 06/19/2013 0908   MONOABS 2.0* 06/14/2013 0908   EOSABS 0.1 06/14/2013 0908   BASOSABS 0.0 06/06/2013 0908     CMP     Component Value Date/Time   NA 140 06/06/2013 0500   K 3.5 06/06/2013 0500   CL 104 06/06/2013 0500   CO2 27 06/06/2013 0500   GLUCOSE 81 06/06/2013 0500   BUN 6 06/06/2013 0500   CREATININE 0.20* 06/06/2013 0500   CALCIUM 8.5 06/06/2013 0500   PROT 6.8 06/01/2013 0500   ALBUMIN 2.5* 06/01/2013 0500   AST 29 06/01/2013 0500   ALT 42 06/01/2013 0500   ALKPHOS 66 06/01/2013 0500   BILITOT 0.5 06/01/2013 0500   GFRNONAA >90 06/06/2013 0500   GFRAA >90 06/06/2013 0500    Chest Xray Reviewed/Impressions: Right side collapse improved,Slight improved aeration in the right lung base. Persistent  right-sided effusion is noted.    CT scan of the chest Reviewed/Impressions:Complete collapse of the right lung secondary to occlusion of the  right mainstem bronchus. Bronchoscopic evaluation should be  considered. These results will be called to the ordering  clinician  or representative by the Radiologist Assistant, and communication  documented in the PACS Dashboard.     Time In Time Out Total Time Spent with Patient Total Overall Time  215 pm 300 pm  75 min 75  min    Greater than 50%  of this time was spent counseling and coordinating care related to the above assessment and plan.    Severino Paolo L. Ladona Ridgel, MD MBA The Palliative Medicine Team at [facility] Phone: [phone] Pager: [phone]

## 2013-06-06 NOTE — Progress Notes (Signed)
Pt agreed to back on bipap.  Pt tol well, sats remain 76% on 100% bipap.  Waiting on pallative care to see pt.

## 2013-06-06 NOTE — Progress Notes (Signed)
Pt refusing BIPAP, RN and another RT at [facility].  Pt became very persistent that he does NOT want to wear bipap.  RN contacting hospice and MD re: this.

## 2013-06-07 ENCOUNTER — Inpatient Hospital Stay (HOSPITAL_COMMUNITY): Payer: Federal, State, Local not specified - PPO

## 2013-06-07 LAB — BASIC METABOLIC PANEL
BUN: 6 mg/dL (ref 6–23)
Calcium: 8.6 mg/dL (ref 8.4–10.5)
Chloride: 105 mEq/L (ref 96–112)
GFR calc Af Amer: >90 mL/min (ref >90)
GFR calc non Af Amer: >90 mL/min (ref >90)
Glucose, Bld: 83 mg/dL (ref 70–99)
Potassium: 3.4 mEq/L — ABNORMAL LOW (ref 3.5–5.1)
Sodium: 142 mEq/L (ref 135–145)

## 2013-06-07 LAB — CBC
HCT: 36.8 % — ABNORMAL LOW (ref 39.0–52.0)
Hemoglobin: 12.3 g/dL — ABNORMAL LOW (ref 13.0–17.0)
MCH: 33 pg (ref 26.0–34.0)
MCHC: 33.4 g/dL (ref 30.0–36.0)
Platelets: 219 10*3/uL (ref 150–400)
RBC: 3.73 MIL/uL — ABNORMAL LOW (ref 4.22–5.81)
RDW: 14 % (ref 11.5–15.5)

## 2013-06-07 LAB — VANCOMYCIN, TROUGH: Vancomycin Tr: 17.5 ug/mL (ref 10.0–20.0)

## 2013-06-07 NOTE — Progress Notes (Signed)
ANTIBIOTIC CONSULT NOTE - FOLLOW UP  Pharmacy Consult for vancomycin, azactam Indication: PNA  Allergies  Allergen Reactions  . Sulfa Antibiotics Anaphylaxis    Unknown   . Cefepime Other (See Comments)    Itching (pt states he can take)  . Xarelto [Rivaroxaban] Other (See Comments)    Acute Mental Status changes    Patient Measurements: Height: 5' 10.08" (178 cm) Weight: 207 lb 14.3 oz (94.3 kg) IBW/kg (Calculated) : 73.18  Vital Signs: Temp: 97.8 F (36.6 C) (12/11 1900) Temp src: Axillary (12/11 1900) BP: 117/79 mmHg (12/11 2000) Pulse Rate: 96 (12/11 2000) Intake/Output from previous day: 12/10 0701 - 12/11 0700 In: 1190 [I.V.:440; IV Piggyback:750] Out: 585 [Urine:585] Intake/Output from this shift: Total I/O In: 60 [I.V.:60] Out: -   Labs:  Recent Labs  06/05/13 0447 06/06/13 0500 06/07/13 0448  WBC  --   --  9.3  HGB  --   --  12.3*  PLT  --   --  219  CREATININE 0.22* 0.20* 0.22*   Estimated Creatinine Clearance: 162.9 ml/min (by C-G formula based on Cr of 0.22).  Recent Labs  06/05/13 1230 06/07/13 2045  VANCOTROUGH 12.7 17.5    Assessment: 27 yo male with MRSA PNA continues on vancomycin . A vancomycin trough was checked tonight and it is therapeutic at 17.5 mcg/ml  on Vancomycin 1250 mg IV q8h.  Pt is afebrile and WBC is WNL.   Zyvox 12/4 >>12/7 Azactam 12/4>>12/10 Vanc 12/7>>  12/4 urine cx- mult types 12/4 blood cx x 2- negative/final 12/4 resp - MRSA  Goal of Therapy:  Vancomycin trough level 15-20 mcg/ml  Plan:  Continue vancomycin at 1250mg  IV Q8H f/u renal fxn, C&S, clinical status and trough at [facility]  Noah Delaine, RPh Clinical Pharmacist Pager: [phone] 06/07/2013 10:42 PM

## 2013-06-07 NOTE — Progress Notes (Signed)
Pt still refusing chest vest at this time. Pt remains on NRB sat 85% and resting comfortably.

## 2013-06-07 NOTE — Progress Notes (Signed)
Pt. Spoke with aunt on the phone, with me in the room,  and told her that he would go on the bipap machine. Reparatory was called and pt. Refused the bipap machine.

## 2013-06-07 NOTE — Progress Notes (Addendum)
NUTRITION FOLLOW UP  INTERVENTION: Advance diet as medically appropriate (Regular?)  Continue Ensure Complete PO BID, each supplement provides 350 kcal and 13 grams of protein RD to follow for nutrition care plan  NUTRITION DIAGNOSIS: Increased nutrient needs related to multiple wounds as evidenced by estimated nutrition needs, ongoing  Goal: Intake to meet >90% of estimated nutrition needs, unmet  Monitor:  PO intake, goals of care, labs, weight trend  ASSESSMENT: Patient is a 27 yo C4 quadriplegic resident af a SNF admitted with worsening dyspnea. Chest imaging revealed complete R lung collapse. Intubated for bronchoscopy.   Patient extubated 12/5.  Currently on non-breather mask.  Continues on a Full Liquid diet.  Per RN, PO intake remains poor, however, drinking some of his Ensure Complete supplements. Noted patient continues to refuse any interventions, however, hesitant to make decision regarding full comfort care/home with Hospice.  Palliative Care Team to meet with patient & family today.  Height: Ht Readings from Last 1 Encounters:  06/14/2013 5' 10.08" (1.78 m)    Weight: Wt Readings from Last 1 Encounters:  05/28/2013 207 lb 14.3 oz (94.3 kg)    BMI:  Body mass index is 29.76 kg/(m^2).  Re-estimated Needs: Kcal: 2100-2300 Protein: 115-130 gm Fluid: 2.2-2.4 L  Skin: stage II pressure ulcer on sacrum; deep tissue injury to heel; stage I pressure ulcer to elbow  Diet Order: Full Liquid   Intake/Output Summary (Last 24 hours) at 06/07/13 1151 Last data filed at 06/07/13 0700  Gross per 24 hour  Intake   1110 ml  Output    410 ml  Net    700 ml    Labs:   Recent Labs Lab 06/05/13 0447 06/06/13 0500 06/07/13 0448  NA 140 140 142  K 3.1* 3.5 3.4*  CL 101 104 105  CO2 28 27 27   BUN 7 6 6   CREATININE 0.22* 0.20* 0.22*  CALCIUM 8.5 8.5 8.6  GLUCOSE 83 81 83    CBG (last 3)   Recent Labs  06/05/13 1546  GLUCAP 91    Scheduled Meds: .  albuterol  2.5 mg Nebulization Q6H  . antiseptic oral rinse  15 mL Mouth Rinse QID  . ARIPiprazole  10 mg Oral Daily  . baclofen  10 mg Oral TID  . benztropine  0.5 mg Oral BID  . chlorhexidine  15 mL Mouth Rinse BID  . clonazePAM  2 mg Oral BID  . dabigatran  150 mg Oral BID  . feeding supplement (ENSURE COMPLETE)  237 mL Oral BID BM  . FLUoxetine  40 mg Oral Daily  . gabapentin  800 mg Oral TID  . levofloxacin  750 mg Oral Daily  . oxybutynin  5 mg Oral BID  . QUEtiapine  400 mg Oral QHS  . valproic acid  750 mg Oral BID  . vancomycin  1,250 mg Intravenous Q8H    Continuous Infusions: . sodium chloride 20 mL/hr (06/07/13 6213)    Past Medical History  Diagnosis Date  . Quadriplegia, C1-C4 complete   . Bipolar 1 disorder   . Respiratory failure, chronic   . GERD (gastroesophageal reflux disease)   . Pressure ulcer   . Anxiety   . Depression     Past Surgical History  Procedure Laterality Date  . Colostomy      Maureen Chatters, RD, LDN Pager #: [phone] After-Hours Pager #: [phone]

## 2013-06-07 NOTE — Progress Notes (Signed)
Pt is sleeping and resting comfortably on NRB with sat 89%. Agreed with RN to not disturb pt at this time. Will offer Chest Vest and IPPB at 0200 with neb tx. RT will continue to monitor.

## 2013-06-07 NOTE — Progress Notes (Addendum)
Pt transferred to [facility]. This CSW now following case, and will facilitate discharge to [facility] when medically appropriate.  Addendum: CSW got call from pt's previous CSW stating pt is on 100% non-rebreather and refusing BiPap. Pt is from [facility], and this facility cannot take him with this O2 level. Facilities that can take him with the 100% non-rebreather are [facility] and [facility]. Palliative to have a meeting with pt today, as pt was "hesitant 12/10 to make decision regarding full comfort care/ home with Hospice but has cont to refuse any interventions. He has been very persistent in his wishes for no intubation/life support...palliative care also following," per MD note. CSW will read palliative note after they meet with pt/family. CSW will explain that pt cannot go back to [facility] on 100% non-rebreather and [facility] or [facility] are his options. Pt can return to [facility] if his O2 needs are not as significant. [facility] does take BiPap, but facility needs to know settings and how often he stays on BiPap--likely won't take him if he is on continuous BiPap but clinicals can be submitted and facility will consider it.  Maryclare Labrador, MSW, [facility] Clinical Social Worker [phone]

## 2013-06-07 NOTE — Progress Notes (Signed)
Patient Alan Sanford      DOB: Jan 17, 1986      HYQ:[id number]   Palliative Medicine Team at [facility] Progress Note    Subjective:  Patient seen in am but very lethargic. Returned this evening with mother present.  Alan Sanford was awake alert not as disoriented as yesterday .  He did not admit to remembering any of our conversation from the night before.  We reviewed the talks that we had about his wishes and hospice care.  He evidently had a conversation with is aunt last evening and promised her to continue to push on.  He was able to articulate that he wants to take one day at a time.  He gave me permission for the psychiatrist to see him to make recommendations about mood stabilization .  He expressed a hope to get back to reviewing videos and working with his tablet. With that in mind his goals at present are to see if he can recover from this illness.  We talked about the fact that it could happen again and he would need to start thinking about how to guide his care team related to future goals of care.  He remains resolute about not being intubated or having CPR.  He stated that he would do bipap if it would help but he has consistently refused it to this point.  His aunt is coming tomorrow and I have permission to moderate goals of care as there may be some subtle manipulation of goals from her perspective.  Alan Sanford is also not eating.  We talked about his options.  He expressed he does not want a feeding tube.       Filed Vitals:   06/07/13 0750  BP: 101/62  Pulse: 89  Temp: 97.6 F (36.4 C)  Resp: 16   Physical exam:   General: no excess work of breathing, NRB in place with tenuous sats at best. Chest decreseased bilaterally CVS: regular S1, S2 Abd: obese, soft, not tender Ext: quadraparesis Neuro: slightly less confused. Not somnolent at present    Assessment and plan: 27 yr old white male with quadraparesis s/p suicide attempt in 2011.  Admitted with respiratory failure  secondary to pneumonia with mucous plugging collapsed lung.  Tolerated bronch but now with opposite side collapse.  1.  DNR / DNI   Patient can use bipap PRn   2.  Failure to thrive/anorexia:  Continue to encourage po intake, patient does not wish feeding tube placement. Perhaps with mood stabilization this will improve. 3.  Bipolar : will speak with CCM in am psych eval related to medication s which have been held may be appropriate and patient agreeable.  Will continue to work with patient goals.  Continue to treat pneumonia withing the confines of his goals for no intubation.    Total time 60 min  Jamyia Fortune L. Ladona Ridgel, MD MBA The Palliative Medicine Team at [facility] Phone: [phone] Pager: [phone]

## 2013-06-07 NOTE — Progress Notes (Signed)
PULMONARY  / CRITICAL CARE MEDICINE  Name: Alan Sanford MRN: [id number] DOB: 1985-08-25    ADMISSION DATE:  06/13/2013 CONSULTATION DATE:  06/20/2013  REFERRING MD :  EDP PRIMARY SERVICE:  PCCM  CHIEF COMPLAINT:  Acute respiratory failure  BRIEF PATIENT DESCRIPTION: 27 yo C4 quadriplegic. SNF resident admitted with progressive dyspnea.  Chest imaging revealed complete R lung collapse.  Intubated for bronchoscopy.  SIGNIFICANT EVENTS / STUDIES:  12/04  Admitted with acute respiratory failure, failed BiPAP 12/04  CTA chest:  R lung collapse / occlusion of mainstem bronchus 12/04  Bronchoscopy >>> Copious amounts of purulent secretions aspirated from R lung 12/05  Extubated 12/06  Family discussion > Aggressive medical management, but DNI/DNR and palliation if worse 12/11 CXR: collapse of L lung  LINES / TUBES: ETT  12/04 >>> 12/05 R [place] midline - chronic Foley - chronic  CULTURES: 12/04 Blood >> NEG 12/04 Urine >>> multiple morphotypes  12/04 Respiratory >> MRSA  ANTIBIOTICS: Aztreonam 12/04 >> 12/10 Vanc 12/04 >>  Levoflox 12/10 >>    INTERVAL HISTORY:   No new complaints. Still req 100% NRB. desats easily.  Complete white out L hemithorax presumed r/t mucous plugging.  Palliative care now following.   VITAL SIGNS: Temp:  [97.6 F (36.4 C)-98.2 F (36.8 C)] 97.6 F (36.4 C) (12/11 0750) Pulse Rate:  [89-117] 89 (12/11 0750) Resp:  [16-37] 16 (12/11 0750) BP: (81-102)/(38-71) 101/62 mmHg (12/11 0750) SpO2:  [68 %-93 %] 92 % (12/11 0750) FiO2 (%):  [100 %] 100 % (12/10 1334)  HEMODYNAMICS:   VENTILATOR SETTINGS: Vent Mode:  [-] BIPAP FiO2 (%):  [100 %] 100 % Set Rate:  [16 bmp] 16 bmp INTAKE / OUTPUT: Intake/Output     12/10 0701 - 12/11 0700 12/11 0701 - 12/12 0700   P.O.     I.V. (mL/kg) 440 (4.7)    IV Piggyback 750    Total Intake(mL/kg) 1190 (12.6)    Urine (mL/kg/hr) 585 (0.3)    Stool     Total Output 585     Net +605            PHYSICAL  EXAMINATION: General:  No distress Neuro:  Quadriplegia, sleepy but arouses to stimulation HEENT:  Moist membranes, oxygen mask on Cardiovascular:  Regular, no murmurs Lungs:  Diminished breath sounds on L now, R coarse  Abdomen:  Soft, bowel sounds present Musculoskeletal:  Contractures, anasarca  LABS: I have reviewed all of today's lab results. Relevant abnormalities are discussed in the A/P section  CXR:  12/11 complete white out L   ASSESSMENT / PLAN:  PULMONARY A:  Acute on chronic resp failure Pneumonia Recurrent lung collapse  Mucus plug, recurrent.   P:   Cont NTS PRN, nebs and mucolytics Chest PT, vibravest if pt agreeable - has been refusing  Symptom management for dyspnea   CARDIOVASCULAR A: Chronic hypotension. P:  Monitor No rx unless symptomatic  RENAL A:  Hypokalemia. Chronic Foley P:   Monitor BMET intermittently Correct electrolytes as indicated   GASTROINTESTINAL A:  No active issues. P:   Cont diet  HEMATOLOGIC A:  Chronic anticoagulation for multiple VTE. P:  Trend CBC Preadmission Pradaxa  INFECTIOUS A:  MRSA PNA P:   Cultures and antibiotics as above  ENDOCRINE  A:  No active issues. P:   No intervention required  NEUROLOGIC A:  C4 quadriplegia. Bipolar disorder. P:   Preadmission Baclofen, Abilify, Seroquel, Cogentin, Klonopin, Neurontin, Valproic acid Hold Prozac as on  Zyvox   Palliative care to meet with patient again 12/11.  He was hesitant 12/10 to make decision regarding full comfort care/ home with Hospice but has cont to refuse any interventions.  He has been very persistent in his wishes for no intubation/life support.  Will cont to treat medically for now with no excalation of care and aggressive symptom management.  Full DNR.  Will cont to address goals of care, palliative care also following.  Pt/family maybe ready to transition to comfort care alone.   Danford Bad, NP 06/07/2013  10:36 AM Pager: [phone]  [phone] or [phone]  *Care during the described time interval was provided by me and/or other providers on the critical care team. I have reviewed this patient's available data, including medical history, events of note, physical examination and test results as part of my evaluation.    PCCM ATTENDING: I have interviewed and examined the patient and reviewed the database. I have formulated the assessment and plan as reflected in the note above with amendments made by me.   Billy Fischer, MD;  PCCM service; Mobile [phone]

## 2013-06-08 DIAGNOSIS — R0902 Hypoxemia: Secondary | ICD-10-CM

## 2013-06-08 MED ORDER — MORPHINE SULFATE 2 MG/ML IJ SOLN
2.0000 mg | INTRAMUSCULAR | Status: DC | PRN
  Administered 2013-06-08: 4 mg via INTRAVENOUS
  Administered 2013-06-08 (×4): 2 mg via INTRAVENOUS
  Administered 2013-06-09: 4 mg via INTRAVENOUS
  Administered 2013-06-09 – 2013-06-11 (×8): 2 mg via INTRAVENOUS
  Filled 2013-06-08 (×3): qty 1
  Filled 2013-06-08: qty 2
  Filled 2013-06-08 (×5): qty 1
  Filled 2013-06-08: qty 2
  Filled 2013-06-08 (×3): qty 1
  Filled 2013-06-08: qty 2

## 2013-06-08 NOTE — Progress Notes (Signed)
Patient [name]      DOB: May 14, 1986      HQI:[id number]  Discussed  Case with patient's mom [name] updated her on events of night.  She had been talking with nursing.  Plan to let Duward rest on bipap.  She will be down after lunch.  His aunt may come and may need from assistance from chaplain service if she is over wrought.  Will advise staff to use judgement in separating aunt from patient if her emotions can not be controlled.  This is supported by patient's family who has seen this family members response to death and grief before.  Will return to see patient this afternoon.  Advised patient's mom that we are likely heading down the slippery slope and that the patient might experience a hospital death if he continues to decline.  She asked if the chaplains could visit to pray over him   Vincentina Sollers L. Ladona Ridgel, MD MBA The Palliative Medicine Team at [facility] Phone: [phone] Pager: [phone]

## 2013-06-08 NOTE — Progress Notes (Signed)
Pt. Was placed on bipap and continued to have labored breathing with elevated respirations. Notified MD and was given orders for 2-4mg  of morphine q3hrs. 2mg  was given and upon reassessment pts bp decreased to 79/48. MD was notified, no new orders at this time, will continue to monitor.

## 2013-06-08 NOTE — Progress Notes (Signed)
eLink Physician-Brief Progress Note Patient Name: Alan Sanford DOB: 1985-11-03 MRN: [id number]  Date of Service  06/08/2013   HPI/Events of Note   Notified by RN for increased work of breathing Camera > tachypnic on BIPAP Chart reviewed  eICU Interventions  Morphine 2-4mg  IV q3h for now   Intervention Category Major Interventions: Respiratory failure - evaluation and management  MCQUAID, DOUGLAS 06/08/2013, 2:42 AM

## 2013-06-08 NOTE — Progress Notes (Signed)
Patient WU:[id number] L Loughney      DOB: 1986-06-16      YNW:[id number]  Revisited with Jill Alexanders this evening.  No family at the bedside.  He is off Bipap and feeling ok.  Still struggling with breath.  Aunt to arrive possibly in am will reengage family at that time.   Aidah Forquer L. Ladona Ridgel, MD MBA The Palliative Medicine Team at [facility] Phone: [phone] Pager: [phone]

## 2013-06-08 NOTE — Progress Notes (Signed)
eLink Physician-Brief Progress Note Patient Name: LYNDELL ALLAIRE DOB: 19-Jan-1986 MRN: [id number]  Date of Service  06/08/2013   HPI/Events of Note   Breathing improved Hypotensive post morphine   eICU Interventions  Continue to observe, focus care on comfort   Intervention Category Major Interventions: Respiratory failure - evaluation and management;Shock - evaluation and management  MCQUAID, DOUGLAS 06/08/2013, 3:33 AM

## 2013-06-08 NOTE — Progress Notes (Signed)
Placed patient on BIPAP due to increased WOB and use of accessory muscles.  Patient on NRB with SPO2 in the low 80's.  Nebulizer tx given via BIPAP.

## 2013-06-08 NOTE — Progress Notes (Signed)
PULMONARY  / CRITICAL CARE MEDICINE  Name: Alan Sanford MRN: [id number] DOB: 11-15-1985    ADMISSION DATE:  06/04/2013 CONSULTATION DATE:  05/29/2013  REFERRING MD :  EDP PRIMARY SERVICE:  PCCM  CHIEF COMPLAINT:  Acute respiratory failure  BRIEF PATIENT DESCRIPTION: 27 yo C4 quadriplegic. SNF resident admitted with progressive dyspnea.  Chest imaging revealed complete R lung collapse.  Intubated for bronchoscopy.  SIGNIFICANT EVENTS / STUDIES:  12/04  Admitted with acute respiratory failure, failed BiPAP 12/04  CTA chest:  R lung collapse / occlusion of mainstem bronchus 12/04  Bronchoscopy >>> Copious amounts of purulent secretions aspirated from R lung 12/05  Extubated 12/06  Family discussion > Aggressive medical management, but DNI/DNR and palliation if worse 12/11 CXR: collapse of L lung 12-12 continues to decline  LINES / TUBES: ETT  12/04 >>> 12/05 R [place] midline - chronic Foley - chronic  CULTURES: 12/04 Blood >> NEG 12/04 Urine >>> multiple morphotypes  12/04 Respiratory >> MRSA  ANTIBIOTICS: Aztreonam 12/04 >> 12/10 Vanc 12/04 >>  Levoflox 12/10 >>    INTERVAL HISTORY:   Poorly responsive , on NRB   VITAL SIGNS: Temp:  [96.8 F (36 C)-98.4 F (36.9 C)] 98.4 F (36.9 C) (12/12 0842) Pulse Rate:  [83-107] 93 (12/12 1039) Resp:  [10-40] 15 (12/12 1039) BP: (79-117)/(48-79) 94/53 mmHg (12/12 0842) SpO2:  [85 %-99 %] 98 % (12/12 1039) FiO2 (%):  [100 %] 100 % (12/12 1039)  HEMODYNAMICS:   VENTILATOR SETTINGS: Vent Mode:  [-] BIPAP FiO2 (%):  [100 %] 100 % PEEP:  [8 cmH20] 8 cmH20 Pressure Support:  [6 cmH20] 6 cmH20 INTAKE / OUTPUT: Intake/Output     12/11 0701 - 12/12 0700 12/12 0701 - 12/13 0700   P.O. 240 120   I.V. (mL/kg) 460 (4.9) 80 (0.8)   IV Piggyback 750    Total Intake(mL/kg) 1450 (15.4) 200 (2.1)   Urine (mL/kg/hr) 825 (0.4)    Stool 1 (0)    Total Output 826     Net +624 +200          PHYSICAL EXAMINATION: General:  No  distress Neuro:  Quadriplegia, poorly responsive HEENT: oxygen mask on Cardiovascular:  Regular, no murmurs Lungs:  Diminished breath sounds on L now, R coarse  Abdomen:  Soft, bowel sounds present Musculoskeletal:  Contractures, anasarca  LABS:  Recent Labs Lab 06/05/13 0447 06/06/13 0500 06/07/13 0448  NA 140 140 142  K 3.1* 3.5 3.4*  CL 101 104 105  CO2 28 27 27   BUN 7 6 6   CREATININE 0.22* 0.20* 0.22*  GLUCOSE 83 81 83    Recent Labs Lab 06/03/13 0500 06/04/13 0500 06/07/13 0448  HGB 12.7* 12.2* 12.3*  HCT 37.5* 35.9* 36.8*  WBC 13.0* 8.5 9.3  PLT 176 159 219    Dg Chest Port 1 View  06/07/2013   CLINICAL DATA:  Respiratory failure.  EXAM: PORTABLE CHEST - 1 VIEW  COMPARISON:  06/05/2013.  FINDINGS: Interval development of complete consolidation of the left thorax with mediastinal shift to left raising possibility of mucous plug. Underlying infiltrate, pleural effusion, atelectasis or mass not excluded.  Pulmonary vascular congestion right lung with right-sided pleural effusion/basilar atelectasis. Right lower lobe infiltrate not excluded.  Right central line distal superior vena cava/cavoatrial junction level.  IMPRESSION: Interval development of complete consolidation of the left thorax with mediastinal shift to left raising possibility of mucous plug. Please see above discussion.  This is a call report.  Electronically Signed   By: Bridgett Larsson M.D.   On: 06/07/2013 07:22     CXR:  12/11 complete white out L   ASSESSMENT / PLAN:  PULMONARY A:  Acute on chronic resp failure Pneumonia Recurrent lung collapse  Mucus plug, recurrent.   P:   Cont NTS PRN, nebs and mucolytics Chest PT, vibravest if pt agreeable - has been refusing  Symptom management for dyspnea  He is declining and comfort may be goal  CARDIOVASCULAR A: Chronic hypotension. P:  Monitor No rx unless symptomatic  RENAL A:  Hypokalemia. Chronic Foley P:   Monitor BMET  intermittently Correct electrolytes as indicated   GASTROINTESTINAL A:  No active issues. P:   Cont diet  HEMATOLOGIC A:  Chronic anticoagulation for multiple VTE. P:  Trend CBC Preadmission Pradaxa  INFECTIOUS A:  MRSA PNA P:   Cultures and antibiotics as above  ENDOCRINE  A:  No active issues. P:   No intervention required  NEUROLOGIC A:  C4 quadriplegia. Bipolar disorder. P:   Comfort care, received MSO4 during the night.  Brett Canales Minor ACNP [facility] PCCM Pager [phone] till 3 pm If no answer page [phone] 06/08/2013, 11:31 AM  Palliative care to meet with patient again 12/11.  He was hesitant 12/10 to make decision regarding full comfort care/ home with Hospice but has cont to refuse any interventions.  He has been very persistent in his wishes for no intubation/life support.  Will cont to treat medically for now with no excalation of care and aggressive symptom management.  Full DNR.  Will cont to address goals of care, palliative care also following.  Pt/family maybe ready to transition to comfort care alone.   Respiratory failure again overnight, required BiPAP.  Patient is clearly decompensating and will not survive this admission.  There is a lot of family dynamics concerns but he has made his wishes more than clear on multiple occassions.  Recommend to switch to full comfort mode but will keep monitoring for now.  Appreciate input from palliative care service.  CC time 35 min.  Alyson Reedy, M.D. [facility] Pulmonary/Critical Care Medicine. Pager: [phone]. After hours pager: [phone].

## 2013-06-08 NOTE — Progress Notes (Signed)
RT Note:  IPPB not done at this time because pt remaining on Bipap 12/8 100%  Alan Sanford

## 2013-06-09 ENCOUNTER — Inpatient Hospital Stay (HOSPITAL_COMMUNITY): Payer: Federal, State, Local not specified - PPO

## 2013-06-09 DIAGNOSIS — I2699 Other pulmonary embolism without acute cor pulmonale: Secondary | ICD-10-CM

## 2013-06-09 MED ORDER — ALBUTEROL SULFATE (5 MG/ML) 0.5% IN NEBU: 2.5000 mg | INHALATION_SOLUTION | Freq: Four times a day (QID) | RESPIRATORY_TRACT | Status: DC | PRN

## 2013-06-09 NOTE — Progress Notes (Signed)
RT Note: Pt called due to pt desat of 77%. Pt on Bipap FiO2 100% Increased to 18/10 SPO2 staying in high 80's. RT will continue to monitor

## 2013-06-09 NOTE — Progress Notes (Signed)
PULMONARY  / CRITICAL CARE MEDICINE  Name: Alan Sanford MRN: [id number] DOB: Jan 05, 1986    ADMISSION DATE:  06/14/2013 CONSULTATION DATE:  06/25/2013  REFERRING MD :  EDP PRIMARY SERVICE:  PCCM  CHIEF COMPLAINT:  Acute respiratory failure  BRIEF PATIENT DESCRIPTION: 27 yo C4 quadriplegic. SNF resident admitted with progressive dyspnea.  Chest imaging revealed complete R lung collapse.  Intubated for bronchoscopy.  SIGNIFICANT EVENTS / STUDIES:  12/04  Admitted with acute respiratory failure, failed BiPAP 12/04  CTA chest:  R lung collapse / occlusion of mainstem bronchus 12/04  Bronchoscopy >>> Copious amounts of purulent secretions aspirated from R lung 12/05  Extubated 12/06  Family discussion > Aggressive medical management, but DNI/DNR and palliation if worse 12/11 CXR: collapse of L lung 12-12 continues to decline  LINES / TUBES: ETT  12/04 >>> 12/05 R [place] midline - chronic Foley - chronic  CULTURES: 12/04 Blood >> NEG 12/04 Urine >>> multiple morphotypes  12/04 Respiratory >> MRSA  ANTIBIOTICS: Aztreonam 12/04 >> 12/10 Vanc 12/04 >>  Levoflox 12/10 >>    INTERVAL HISTORY:   Asked I could remove his TV handset/speaker so he could hear me. Denied dyspnea, pain or need for suction.  VITAL SIGNS: Temp:  [97.6 F (36.4 C)-98.7 F (37.1 C)] 98.5 F (36.9 C) (12/13 0800) Pulse Rate:  [81-89] 81 (12/13 0800) Resp:  [9-15] 15 (12/13 0800) BP: (89-107)/(44-76) 96/44 mmHg (12/13 0800) SpO2:  [90 %-98 %] 90 % (12/13 0824) FiO2 (%):  [100 %] 100 % (12/13 0824) Weight:  [92.2 kg (203 lb 4.2 oz)] 92.2 kg (203 lb 4.2 oz) (12/13 0455)  HEMODYNAMICS:   VENTILATOR SETTINGS: Vent Mode:  [-]  FiO2 (%):  [100 %] 100 % INTAKE / OUTPUT: Intake/Output     12/12 0701 - 12/13 0700 12/13 0701 - 12/14 0700   P.O. 360    I.V. (mL/kg) 480 (5.2)    IV Piggyback 750    Total Intake(mL/kg) 1590 (17.2)    Urine (mL/kg/hr) 750 (0.3)    Stool     Total Output 750     Net +840             PHYSICAL EXAMINATION: General:  No distress, awake, passive affect Neuro:  Quadriplegia,  HEENT: oxygen mask on Cardiovascular:  Regular, no murmurs Lungs:  Diminished breath sounds, bilaterally coarse upper airway sounds Abdomen:  Soft, bowel sounds present Musculoskeletal:  Contractures, anasarca  LABS:  Recent Labs Lab 06/05/13 0447 06/06/13 0500 06/07/13 0448  NA 140 140 142  K 3.1* 3.5 3.4*  CL 101 104 105  CO2 28 27 27   BUN 7 6 6   CREATININE 0.22* 0.20* 0.22*  GLUCOSE 83 81 83    Recent Labs Lab 06/03/13 0500 06/04/13 0500 06/07/13 0448  HGB 12.7* 12.2* 12.3*  HCT 37.5* 35.9* 36.8*  WBC 13.0* 8.5 9.3  PLT 176 159 219    No results found.   CXR:  12/11 complete white out L   ASSESSMENT / PLAN:  PULMONARY A:  Acute on chronic resp failure Pneumonia Recurrent lung collapse  Mucus plug, recurrent.   P:   Cont NTS PRN, nebs and mucolytics Chest PT, vibravest if pt agreeable - has been refusing  Symptom management for dyspnea  He is declining and comfort may be goal P- update CXR, continue BiPAP at night  CARDIOVASCULAR A: Chronic hypotension. P:  Monitor No rx unless symptomatic  RENAL A:  Hypokalemia. Chronic Foley P:   Monitor BMET  intermittently Correct electrolytes as indicated   GASTROINTESTINAL A:  No active issues. P:   Cont diet  HEMATOLOGIC A:  Chronic anticoagulation for multiple VTE. P:  Trend CBC Preadmission Pradaxa  INFECTIOUS A:  MRSA PNA P:   Cultures and antibiotics as above  ENDOCRINE  A:  No active issues. P:   No intervention required  NEUROLOGIC A:  C4 quadriplegia. Bipolar disorder. P:   Comfort care, received MSO4 during the night.  Brett Canales Minor ACNP Adolph Pollack PCCM Pager [phone] till 3 pm If no answer page [phone] 06/08/2013, 11:31 AM  Palliative care to meet with patient again 12/11.  He was hesitant 12/10 to make decision regarding full comfort care/ home with Hospice but has  cont to refuse any interventions.  He has been very persistent in his wishes for no intubation/life support.  Will cont to treat medically for now with no excalation of care and aggressive symptom management.  Full DNR.  Will cont to address goals of care, palliative care also following.  Pt/family maybe ready to transition to comfort care alone.   Prior note:Respiratory failure again overnight, required BiPAP.  Patient is clearly decompensating and will not survive this admission.  There is a lot of family dynamics concerns but he has made his wishes more than clear on multiple occassions.  Recommend to switch to full comfort mode but will keep monitoring for now.  Appreciate input from palliative care service. 06/09/13- Nurse reports family still paying to hold NH bed and will need guidance.  CD Maple Hudson, MD PCCM p [phone]   m [phone] After hours pager: [phone].

## 2013-06-09 NOTE — Progress Notes (Signed)
Patient Alan Sanford      DOB: Jul 11, 1985      VWU:[id number]   Palliative Medicine Team at [facility] Progress Note    Subjective:  Patient back on bipap with decreasing sats.  Confused this am.  Spoke with his mom reviewed chest xray.  Left lung reexpanded.  Dilemma will be that he is on pradaxa!  Discussed with Dr. Maple Hudson.  Will reassess xray in am and consider Ultrasound evaluation first to assure this is truly effusion.     Filed Vitals:   06/09/13 1200  BP: 89/42  Pulse: 88  Temp: 98.3 F (36.8 C)  Resp: 19   Physical exam:  General : mild distress, on bipap PERRL, EOMI Chest decreased throughout CVS: regular, S1, S2 Abd: obese,  Ext: contracted, quad Neuro: some confusion earlier, quad   Lab Results  Component Value Date   CREATININE 0.22* 06/07/2013   BUN 6 06/07/2013   NA 142 06/07/2013   K 3.4* 06/07/2013   CL 105 06/07/2013   CO2 27 06/07/2013   Lab Results  Component Value Date   WBC 9.3 06/07/2013   HGB 12.3* 06/07/2013   HCT 36.8* 06/07/2013   MCV 98.7 06/07/2013   PLT 219 06/07/2013   Chest xray reviewed left lung unplugged, right lung with effusion.    Assessment and plan: 27 yr old with quadriplegia after suicide attempt in 2011.  Patient admitted with respiratory failure.  He has requested no vent and no cpr but was hesitant to agree to full comfort care.  We are, therefore, trying to treat within the confines of his wishes.  He remains doing poorly. His aunt is being encouraged to come to promote closure for them both.  The patient and family may move on to comfort at any time but for now trying to treat the treatable.  Right lung effusion will be evaluated again in the am and consideration for options made at that time.  Alan Sanford should be involved in decisions to eval and treat if he is able to.  1.  DNR/DNI 2.  Respiratory failure:  Continue bipap with prn morphine for comfort 3.  Quad with spasms continue baclofen 4. Bipolar: on home  meds when more stable consider psych support   Discussed with Dr. Maple Hudson and Alan Sanford and his mom Alan Sanford   Total time 25 min  Alan Vallie L. Ladona Ridgel, MD MBA The Palliative Medicine Team at [facility] Phone: [phone] Pager: [phone]

## 2013-06-09 NOTE — Progress Notes (Signed)
Placed patient on BiPAP at 1230 due to desaturation to 78%, SATS improved to 95% on BiPAP.

## 2013-06-09 NOTE — Progress Notes (Signed)
Pt after chest PT began to desaturate on Non-re breather to 79-80%. RT present at this time and pt switched from Non-re breather to Bipap at 100%  O2. Sats continued to drop to 74%. Dr. Maple Hudson called, obtained telephone orders for STAT portable chest X-ray, and PRN NT suctioning if needed. Pt's sats at this time is has improved to 87-88%.

## 2013-06-10 ENCOUNTER — Inpatient Hospital Stay (HOSPITAL_COMMUNITY): Payer: Federal, State, Local not specified - PPO

## 2013-06-10 DIAGNOSIS — D649 Anemia, unspecified: Secondary | ICD-10-CM

## 2013-06-10 NOTE — Progress Notes (Signed)
Patient [name]      DOB: Sep 02, 1985      VWU:[id number]   Palliative Medicine Team at [facility] Progress Note    Subjective: Aunt Dawn is at the beside. She was able to express that she feels that Alan Sanford does not understand what is happening  And asserts that she is willing to accept his DNR but that is not " what he has been about in the past".  Dawn asserted that she was one of his power of attorneys so we reviewed his POA documents.  She is listed as third on a document that specific designate "may act alone in the order listed".  I showed her the language so that she understands that Alan Sanford is first and acting on Alan Sanford's behalf and that I was sure she would take her thoughts into consideration , but that she was permitted to act alone according to the courts.  Alan Sanford is sleepy as usually is in am but he said hello and went back off to sleep .  He appears comfortable.  Xrays reviewed with Dawn and Alan Sanford was updated on his condition.       Filed Vitals:   06/10/13 0737  BP: 103/52  Pulse: 97  Temp:   Resp: 16   Physical exam:  General: no acute distress on bipap Pupils equal Chest decreased with distant breath sounds CVS: regular rate and rhythm Abd Obese Ext: contracted quad Neuro: quadriplegia   Xray with further whiteout of right lug, and increasing opacification of left lung  Lab Results  Component Value Date   CREATININE 0.22* 06/07/2013   BUN 6 06/07/2013   NA 142 06/07/2013   K 3.4* 06/07/2013   CL 105 06/07/2013   CO2 27 06/07/2013   Lab Results  Component Value Date   WBC 9.3 06/07/2013   HGB 12.3* 06/07/2013   HCT 36.8* 06/07/2013   MCV 98.7 06/07/2013   PLT 219 06/07/2013    Assessment and plan: 27 yr old male with quadrapelgia s/p suicide attempt admitted with respiratory failure.  Patient is declining intubation and CPR.  He continues to do poorly .  I will attempt to moderate the differences of opinion between family and patient.  Goals  for now were to try to treat the treatable which is very complex because of inablity to intubate and patient being pradaxa which limits invasive procedures (thoracocentesis)   1.  DNR/ DNI  Continue bipap  2.  Possible pleural effusion on right : ? Albumin and lasix while awaiting ability to thoracocentesis if this is fluid? Defer to pulmonary  3.  Pain / Dyspnea; continue prn morphine  4.  Bipolar continue psych meds  5.  Poor appetite: pt has declined feeding tube.  Comfort feed.   Total time 35 min  Alan Sanford L. Ladona Ridgel, MD MBA The Palliative Medicine Team at [facility] Phone: [phone] Pager: [phone]

## 2013-06-10 NOTE — Progress Notes (Signed)
PULMONARY  / CRITICAL CARE MEDICINE  Name: Alan Sanford MRN: [id number] DOB: [birth year]    ADMISSION DATE:  05/30/2013 CONSULTATION DATE:  05/28/2013  REFERRING MD :  EDP PRIMARY SERVICE:  PCCM  CHIEF COMPLAINT:  Acute respiratory failure  BRIEF PATIENT DESCRIPTION: 27 yo C4 quadriplegic. SNF resident admitted with progressive dyspnea.  Chest imaging revealed complete R lung collapse.  Intubated for bronchoscopy.  SIGNIFICANT EVENTS / STUDIES:  12/04  Admitted with acute respiratory failure, failed BiPAP 12/04  CTA chest:  R lung collapse / occlusion of mainstem bronchus 12/04  Bronchoscopy >>> Copious amounts of purulent secretions aspirated from R lung 12/05  Extubated 12/06  Family discussion > Aggressive medical management, but DNI/DNR and palliation if worse 12/11 CXR: collapse of L lung 12-12 continues to decline  LINES / TUBES: ETT  12/04 >>> 12/05 R [place] midline - chronic Foley - chronic  CULTURES: 12/04 Blood >> NEG 12/04 Urine >>> multiple morphotypes  12/04 Respiratory >> MRSA  ANTIBIOTICS: Aztreonam 12/04 >> 12/10 Vanc 12/04 >>  Levoflox 12/10 >>    INTERVAL HISTORY:   RT, Nurse and Aunt Dawn in room. Pt on BiPAP, responsive with muffled speech, nods and head shakes. They report his O2 sat fell yesterday after Vibratory Vest. Discussed potential for therapeutic bronch if he doesn't respond to deep suction and mucolytics. We all saw him nod in agreement when his Aunt asked if he would be willing for bronch.  VITAL SIGNS: Temp:  [98 F (36.7 C)-101.7 F (38.7 C)] 101.6 F (38.7 C) (12/14 0800) Pulse Rate:  [88-102] 101 (12/14 0800) Resp:  [16-28] 20 (12/14 0800) BP: (89-111)/(38-62) 89/38 mmHg (12/14 0800) SpO2:  [86 %-96 %] 95 % (12/14 0800) FiO2 (%):  [100 %] 100 % (12/14 0800) Weight:  [98 kg (216 lb 0.8 oz)] 98 kg (216 lb 0.8 oz) (12/14 0431)  HEMODYNAMICS:   VENTILATOR SETTINGS: Vent Mode:  [-] BIPAP FiO2 (%):  [100 %] 100 % INTAKE /  OUTPUT: Intake/Output     12/13 0701 - 12/14 0700 12/14 0701 - 12/15 0700   P.O.     I.V. (mL/kg) 380 (3.9) 80 (0.8)   IV Piggyback 750    Total Intake(mL/kg) 1130 (11.5) 80 (0.8)   Urine (mL/kg/hr) 875 (0.4)    Total Output 875     Net +255 +80          PHYSICAL EXAMINATION: General:  No distress, awake, passive  Neuro:  Quadriplegia,  HEENT: BiPAP full face mask Cardiovascular:  Regular, no murmurs Lungs:  Diminished breath sounds,  Abdomen:  Soft, bowel sounds  Musculoskeletal:  Contractures, anasarca  LABS:  Recent Labs Lab 06/05/13 0447 06/06/13 0500 06/07/13 0448  NA 140 140 142  K 3.1* 3.5 3.4*  CL 101 104 105  CO2 28 27 27   BUN 7 6 6   CREATININE 0.22* 0.20* 0.22*  GLUCOSE 83 81 83    Recent Labs Lab 06/04/13 0500 06/07/13 0448  HGB 12.2* 12.3*  HCT 35.9* 36.8*  WBC 8.5 9.3  PLT 159 219    Dg Chest Port 1 View  06/10/2013   CLINICAL DATA:  Quadriplegic patient with abundant respiratory secretions. Follow-up atelectasis.  EXAM: PORTABLE CHEST - 1 VIEW  COMPARISON:  Portable examinations yesterday dating back to 06/10/2013.  FINDINGS: Over the past 10 days, there has been waxing and waning of the degree of aeration in the right lung. On today's examination, the atelectasis in the right lung has significantly worsened when  compared to the examination yesterday and 3 days earlier. The mediastinum is shifted to the right. There are increasing airspace opacities in the left upper and lower lobes. Right jugular central venous catheter tip projects over the lower SVC at or near the cavoatrial junction.  IMPRESSION: 1. Marked worsening of atelectasis in the right lung since yesterday. Mucous plugging of the right mainstem bronchus is suspected. 2. Worsening pneumonia throughout the left lung. I called these results by telephone at the time of interpretation on 06/10/2013 at 8:50 AM to Renea Ee, the nurse caring for the patient on the 2600 unit, who verbally acknowledged  these results.   Electronically Signed   By: Hulan Saas M.D.   On: 06/10/2013 08:53   Dg Chest Port 1 View  06/09/2013   CLINICAL DATA:  Hypoxia  EXAM: PORTABLE CHEST - 1 VIEW  COMPARISON:  06/07/2013  FINDINGS: Cardiac shadow is stable. A moderately large right-sided pleural effusion is now seen. The previously noted left-sided pleural effusion has resolved in the interval. Volume loss with mediastinal shift to the right is noted. A right-sided central venous line is again seen and stable.  IMPRESSION: Increasing right-sided pleural effusion with volume loss and mediastinal shift to the right. Improved aeration in the left lung is noted when compare with the previous study.   Electronically Signed   By: Alcide Clever M.D.   On: 06/09/2013 14:01     CXR:  Reviewed. White out R and infiltrate L, Looks like mucus plug obstruction R mainstem and likely aspiration pneumonia.  ASSESSMENT / PLAN:  PULMONARY A:  Acute on chronic resp failure Pneumonia Recurrent lung collapse  Mucus plug, recurrent.   P:   Cont NTS PRN, nebs and mucolytics Chest PT, vibravest if tolerated Symptom management for dyspnea  He is declining and comfort may be goal P- update CXR, continue BiPAP at night  CARDIOVASCULAR A: Chronic hypotension. P:  Monitor No rx unless symptomatic  RENAL A:  Hypokalemia. Chronic Foley P:   Monitor BMET intermittently Correct electrolytes as indicated   GASTROINTESTINAL A:  No active issues. P:   Cont liquid. Nurse doesn't feel he could tolerate more solid diet now.  HEMATOLOGIC A:  Chronic anticoagulation for multiple VTE. P:  Trend CBC Preadmission Pradaxa  INFECTIOUS A:  MRSA PNA P:   Cultures and antibiotics as above.   Reculture sputum 12/14  ENDOCRINE  A:  No active issues. P:   No intervention required  NEUROLOGIC A:  C4 quadriplegia. Bipolar disorder. P:   Comfort care, received MSO4 during the night.  Palliative care  met with patient  again 12/11.  He was hesitant 12/10 to make decision regarding full comfort care/ home with Hospice but has cont to refuse any interventions.  He has been very persistent in his wishes for no intubation/life support.  Will cont to treat medically for now with no escalation of care and aggressive symptom management.  Full DNR.  Will cont to address goals of care, palliative care also following.  Pt/family maybe ready to transition to comfort care alone.  Prior note:Respiratory failure again overnight, required BiPAP.  Patient is clearly decompensating and will not survive this admission.  There is a lot of family dynamics concerns but he has made his wishes more than clear on multiple occassions.  Recommend to switch to full comfort mode but will keep monitoring for now.  Appreciate input from palliative care service. 06/09/13- Nurse reports family still paying to hold NH bed and will  need guidance. 12/14- Aspiration pneumonia, atelectasis with probable mucus plug R mainstem. We will try aggressive deep suctioning today. He is willing to accept therapeutic bronchoscopy if needed tomorrow.  CD Maple Hudson, MD PCCM p [phone]   m [phone] After hours pager: [phone].

## 2013-06-10 NOTE — Progress Notes (Addendum)
ANTIBIOTIC CONSULT NOTE - FOLLOW UP  Pharmacy Consult:  Vancomycin D#8 + Azactam D#5 Indication:  PNA  Allergies  Allergen Reactions  . Sulfa Antibiotics Anaphylaxis    Unknown   . Cefepime Other (See Comments)    Itching (pt states he can take)  . Xarelto [Rivaroxaban] Other (See Comments)    Acute Mental Status changes    Patient Measurements: Height: 5' 10.08" (178 cm) Weight: 216 lb 0.8 oz (98 kg) IBW/kg (Calculated) : 73.18  Vital Signs: Temp: 100.1 F (37.8 C) (12/14 1200) Temp src: Axillary (12/14 1200) BP: 109/59 mmHg (12/14 1200) Pulse Rate: 94 (12/14 1200) Intake/Output from previous day: 12/13 0701 - 12/14 0700 In: 1130 [I.V.:380; IV Piggyback:750] Out: 875 [Urine:875] Intake/Output from this shift: Total I/O In: 80 [I.V.:80] Out: -   Labs: No results found for this basename: WBC, HGB, PLT, LABCREA, CREATININE,  in the last 72 hours Estimated Creatinine Clearance: 165.9 ml/min (by C-G formula based on Cr of 0.22).  Recent Labs  06/07/13 2045  VANCOTROUGH 17.5     Microbiology: Recent Results (from the past 720 hour(s))  CULTURE, BLOOD (ROUTINE X 2)     Status: None   Collection Time    06/19/2013  9:08 AM      Result Value Range Status   Specimen Description BLOOD CENTRAL LINE CHEST RIGHT   Final   Special Requests BOTTLES DRAWN AEROBIC ONLY 10CC   Final   Culture  Setup Time     Final   Value: 06/23/2013 14:31     Performed at [facility]   Culture     Final   Value: NO GROWTH 5 DAYS     Performed at [facility]   Report Status 06/06/2013 FINAL   Final  CULTURE, BLOOD (ROUTINE X 2)     Status: None   Collection Time    06/26/2013 10:25 AM      Result Value Range Status   Specimen Description BLOOD FOOT RIGHT   Final   Special Requests BOTTLES DRAWN AEROBIC ONLY 2CC   Final   Culture  Setup Time     Final   Value: 06/08/2013 14:31     Performed at [facility]   Culture     Final   Value: NO GROWTH 5 DAYS   Performed at [facility]   Report Status 06/06/2013 FINAL   Final  URINE CULTURE     Status: None   Collection Time    06/08/2013 12:55 PM      Result Value Range Status   Specimen Description URINE, CATHETERIZED   Final   Special Requests NONE   Final   Culture  Setup Time     Final   Value: 06/02/2013 14:02     Performed at [facility] Count     Final   Value: >=100,000 COLONIES/ML     Performed at [facility]   Culture     Final   Value: Multiple bacterial morphotypes present, none predominant. Suggest appropriate recollection if clinically indicated.     Performed at [facility]   Report Status 06/01/2013 FINAL   Final  CULTURE, RESPIRATORY (NON-EXPECTORATED)     Status: None   Collection Time    06/12/2013  2:42 PM      Result Value Range Status   Specimen Description TRACHEAL ASPIRATE   Final   Special Requests NONE   Final   Gram Stain  Final   Value: MODERATE WBC PRESENT,BOTH PMN AND MONONUCLEAR     NO SQUAMOUS EPITHELIAL CELLS SEEN     RARE GRAM POSITIVE COCCI IN PAIRS     Performed at [facility]   Culture     Final   Value: MODERATE METHICILLIN RESISTANT STAPHYLOCOCCUS AUREUS     Note: RIFAMPIN AND GENTAMICIN SHOULD NOT BE USED AS SINGLE DRUGS FOR TREATMENT OF STAPH INFECTIONS. This organism is presumed to be Clindamycin resistant based on detection of inducible Clindamycin resistance. CRITICAL RESULT CALLED TO, READ BACK BY AND      VERIFIED WITH: SABO [facility] RN 10AM 06/03/13 GUSTK     Performed at [facility]   Report Status 06/03/2013 FINAL   Final   Organism ID, Bacteria METHICILLIN RESISTANT STAPHYLOCOCCUS AUREUS   Final  MRSA PCR SCREENING     Status: Abnormal   Collection Time    06/20/2013  2:46 PM      Result Value Range Status   MRSA by PCR POSITIVE (*) NEGATIVE Final   Comment:            The GeneXpert MRSA Assay (FDA     approved for NASAL specimens     only), is one component of a      comprehensive MRSA colonization     surveillance program. It is not     intended to diagnose MRSA     infection nor to guide or     monitor treatment for     MRSA infections.     RESULT CALLED TO, READ BACK BY AND VERIFIED WITH:     A. BECK RN 16:15 05/30/2013 ([email or web address])      Assessment: 27 YO quadriplegic male to continue on vancomycin and levofloxacin for sepsis and MRSA PNA.  Patient's renal function has been stable and his previous vancomycin trough was therapeutic.  He is febrile today.  Zyvox 12/4 >>12/7 Azactam 12/4>>12/10 Vanc 12/7 >> Levaquin 12/10>>  12/9 VT = 12.7 on 1000mg  q8h (SCr 0.22) 12/11 VT = 17.5 on 1250mg  q8h (SCr 0.22)  12/4 urine cx- mult types 12/4 blood cx x 2- NEG 12/4 resp- MRSA   Goal of Therapy:  Vancomycin trough level 15-20 mcg/ml   Plan:  - Continue Vanc 1250mg  IV Q8H - Continue Levaquin as ordered - Monitor renal fxn, clinical course, PRN vanc trough - F/U plan regarding antibiotics    Bobette Leyh D. Laney Potash, PharmD, BCPS Pager:  [phone] 06/10/2013, 1:04 PM

## 2013-06-10 NOTE — Progress Notes (Addendum)
NTS patient.  Moderate amout of thick pink tinged secretions. Patient tolerated well, vital signs stable throughout, desated to 80% and recovered quickly to 100% when placed back on BIPAP.

## 2013-06-10 NOTE — Progress Notes (Signed)
Patient ZO:[name]      DOB: 01/11/1986      VWU:[id number]  Met with Alan Sanford, his adoptive mother Alan Sanford, and is former girlfriend Alan Sanford.  Alan Sanford has been talking with his Aunt and family he had decided to change his code status to full code. Alan Sanford is also agreeable to consider bronch if needed. Discussed with Dr. Maple Hudson.  Was given permission to have psychiatry involved, will attempt to do as soon as he is stable.  Explained to [name] that he needs to make these choices for himself based on what he considers quality of life.  I explained that if he is making choices based on where he wants to be located that I can promise that he will always be at [facility] especially if he ends up with a trach.  Family is aware of the paucity of facilities that can care for [name] with a Trach. I asked [name] to think about the feeding tube issue that we had discussed because he has not been eating.   Alan Gutridge L. Ladona Ridgel, MD MBA The Palliative Medicine Team at [facility] Phone: [phone] Pager: [phone]

## 2013-06-10 NOTE — Progress Notes (Signed)
PULMONARY  / CRITICAL CARE MEDICINE  Name: Alan Sanford MRN: [id number] DOB: May 12, 1986    ADMISSION DATE:  06/13/2013 CONSULTATION DATE:  06/07/2013  REFERRING MD :  EDP PRIMARY SERVICE:  PCCM  CHIEF COMPLAINT:  Acute respiratory failure  BRIEF PATIENT DESCRIPTION: 27 yo C4 quadriplegic. SNF resident admitted with progressive dyspnea.  Chest imaging revealed complete R lung collapse.  Intubated for bronchoscopy.  SIGNIFICANT EVENTS / STUDIES:  12/04  Admitted with acute respiratory failure, failed BiPAP 12/04  CTA chest:  R lung collapse / occlusion of mainstem bronchus 12/04  Bronchoscopy >>> Copious amounts of purulent secretions aspirated from R lung 12/05  Extubated 12/06  Family discussion >>> Aggressive medical management, but DNI/DNR and palliation if worse 12/14  Palliative care consulted >>> Full Code, trach OK  LINES / TUBES: ETT  12/04 >>> 12/05 R [place] midline - chronic Foley - chronic  CULTURES: 12/04 Blood >> NEG 12/04 Urine >>> multiple morphotypes  12/04 Respiratory >> MRSA  ANTIBIOTICS: Aztreonam 12/04 >> 12/10 Vanc 12/04 >>> Levoflox 12/10 >>> 12/15  INTERVAL HISTORY:    VITAL SIGNS: Temp:  [98 F (36.7 C)-101.7 F (38.7 C)] 100 F (37.8 C) (12/14 1600) Pulse Rate:  [89-102] 94 (12/14 1610) Resp:  [16-28] 22 (12/14 1610) BP: (89-111)/(38-62) 98/47 mmHg (12/14 1610) SpO2:  [87 %-97 %] 95 % (12/14 1610) FiO2 (%):  [100 %] 100 % (12/14 1610) Weight:  [98 kg (216 lb 0.8 oz)] 98 kg (216 lb 0.8 oz) (12/14 0431)  HEMODYNAMICS:   VENTILATOR SETTINGS: Vent Mode:  [-] BIPAP FiO2 (%):  [100 %] 100 %  INTAKE / OUTPUT: Intake/Output     12/14 0701 - 12/15 0700   I.V. (mL/kg) 240 (2.4)   IV Piggyback 250   Total Intake(mL/kg) 490 (5)   Urine (mL/kg/hr)    Total Output     Net +490         PHYSICAL EXAMINATION: General:  Increased work of breathing while off BiPAP Neuro:  Quadriplegia, sleepy but arouses to stimulation HEENT: BiPAP full face  mask Cardiovascular:  Regular, no murmurs Lungs:  Diminished breath sounds, few rhonchi Abdomen:  Soft, bowel sounds presnet Musculoskeletal:  Contractures, anasarca  LABS: CBC  Recent Labs Lab 06/04/13 0500 06/07/13 0448  WBC 8.5 9.3  HGB 12.2* 12.3*  HCT 35.9* 36.8*  PLT 159 219   Coag's No results found for this basename: APTT, INR,  in the last 168 hours BMET  Recent Labs Lab 06/05/13 0447 06/06/13 0500 06/07/13 0448  NA 140 140 142  K 3.1* 3.5 3.4*  CL 101 104 105  CO2 28 27 27   BUN 7 6 6   CREATININE 0.22* 0.20* 0.22*  GLUCOSE 83 81 83   Electrolytes  Recent Labs Lab 06/05/13 0447 06/06/13 0500 06/07/13 0448  CALCIUM 8.5 8.5 8.6   Sepsis Markers No results found for this basename: LATICACIDVEN, PROCALCITON, O2SATVEN,  in the last 168 hours ABG No results found for this basename: PHART, PCO2ART, PO2ART,  in the last 168 hours Liver Enzymes No results found for this basename: AST, ALT, ALKPHOS, BILITOT, ALBUMIN,  in the last 168 hours Cardiac Enzymes No results found for this basename: TROPONINI, PROBNP,  in the last 168 hours Glucose  Recent Labs Lab 06/05/13 1546  GLUCAP 91   CXR:  12/15 >>> Bilateral airspace disease, improvement in aeration R lung with some residual  ASSESSMENT / PLAN:  PULMONARY A:  Acute on chronic resp failure. Pneumonia. Recurrent R lung  collapse, better now on positive pressure ventilation. Mucus plug, recurrent.   P:   Tracheostomy after holding Pradaxa x 24 hours May need intermittent / nocturnal ventilatory support Goal pH>7.30, SpO2>92 Supplemental oxygen BiPAP PRN Trend ABG/CXR Albuterol / Atrovent Chect PT - Vest  CARDIOVASCULAR A: Chronic hypotension. P:  No intervention required  RENAL A:  Chronic Foley. Hypokalemia. P:   Trend BMP K 10 x 4  GASTROINTESTINAL A:  Dysphagia.  Protein calorie malnutrition. P:   NPO Place NG feeding tube tomorrow after Pradaxa off x 24 hours to avoid nasal  hemorrhage TF per Nutritionist May need PEG  HEMATOLOGIC A:  Chronic anticoagulation for multiple VTE. P:  Trend CBC Hold next dose of Pradaxa (12/16 10 AM) in anticipation of tracheostomy  INFECTIOUS A:  MRSA PNA P:   D/c Levofloxacin Continue Vancomycin x 14 days  ENDOCRINE  A:  No active issues. P:   No intervention required  NEUROLOGIC A:  C4 quadriplegia. Bipolar disorder. P:   Baclofen, Cogentin, Klonopin, Neurontin, Valproic acid Abilify, Prozac, Seroquel Morphine, Oxycodone   I have personally obtained history, examined patient, evaluated and interpreted laboratory and imaging results, reviewed medical records, formulated assessment / plan and placed orders.  CRITICAL CARE:  The patient is critically ill with multiple organ systems failure and requires high complexity decision making for assessment and support, frequent evaluation and titration of therapies, application of advanced monitoring technologies and extensive interpretation of multiple databases. Critical Care Time devoted to patient care services described in this note is 35 minutes.   Lonia Farber, MD Pulmonary and Critical Care Medicine [facility] Pager: [phone]  06/10/2013, 8:08 PM

## 2013-06-11 ENCOUNTER — Inpatient Hospital Stay (HOSPITAL_COMMUNITY): Payer: Federal, State, Local not specified - PPO

## 2013-06-11 LAB — CBC WITH DIFFERENTIAL/PLATELET
Basophils Absolute: 0 10*3/uL (ref 0.0–0.1)
Basophils Relative: 0 % (ref 0–1)
Eosinophils Absolute: 0.1 10*3/uL (ref 0.0–0.7)
Eosinophils Relative: 0 % (ref 0–5)
HCT: 36.2 % — ABNORMAL LOW (ref 39.0–52.0)
Hemoglobin: 11.8 g/dL — ABNORMAL LOW (ref 13.0–17.0)
Lymphs Abs: 3.1 10*3/uL (ref 0.7–4.0)
MCH: 32.6 pg (ref 26.0–34.0)
MCHC: 32.6 g/dL (ref 30.0–36.0)
MCV: 100 fL (ref 78.0–100.0)
Monocytes Absolute: 1.6 10*3/uL — ABNORMAL HIGH (ref 0.1–1.0)
Monocytes Relative: 13 % — ABNORMAL HIGH (ref 3–12)
Neutro Abs: 7.4 10*3/uL (ref 1.7–7.7)
Platelets: 219 10*3/uL (ref 150–400)
RBC: 3.62 MIL/uL — ABNORMAL LOW (ref 4.22–5.81)
RDW: 14.9 % (ref 11.5–15.5)

## 2013-06-11 LAB — BASIC METABOLIC PANEL
BUN: 7 mg/dL (ref 6–23)
Calcium: 8.3 mg/dL — ABNORMAL LOW (ref 8.4–10.5)
Chloride: 107 mEq/L (ref 96–112)
Creatinine, Ser: 0.3 mg/dL — ABNORMAL LOW (ref 0.50–1.35)
GFR calc Af Amer: >90 mL/min (ref >90)
GFR calc non Af Amer: >90 mL/min (ref >90)
Glucose, Bld: 92 mg/dL (ref 70–99)
Potassium: 3 mEq/L — ABNORMAL LOW (ref 3.5–5.1)

## 2013-06-11 MED ORDER — POTASSIUM CHLORIDE 10 MEQ/50ML IV SOLN
10.0000 meq | INTRAVENOUS | Status: AC
  Administered 2013-06-11 (×4): 10 meq via INTRAVENOUS
  Filled 2013-06-11 (×4): qty 50

## 2013-06-11 MED ORDER — OSMOLITE 1.2 CAL PO LIQD
1000.0000 mL | ORAL | Status: DC
  Administered 2013-06-12 – 2013-06-14 (×2): 1000 mL
  Filled 2013-06-11 (×7): qty 1000

## 2013-06-11 NOTE — Progress Notes (Signed)
[facility] cannot take pt on 100% BiPap. Admissions rep from facility states pt must be at 10 L/min O2 or less. They can take pt on a trach, as long as deep suctioning is not required; facility can suction every 4 hours or more, but not more frequently than this. CSW texted RNCM requesting a call so CSW can update RNCM on limitations of facility.   Maryclare Labrador, MSW, [facility] Clinical Social Worker [phone]

## 2013-06-11 NOTE — Progress Notes (Addendum)
CSW called palliative MD and explained that [facility] cannot take pt on 100% BiPap and that facility can take pt as long as he is on 10L/min O2 or less. [facility] can take him if he has trach, as long as deep suctioning is not required and they do not have to suction him more often than every 4 hours. MD explained PCCM will speak with pt again about bronch and limitations of [facility] when this is appropriate--at this time, pt is medically unstable and per MD, pt will not be leaving the hospital for at least a few days, possibly a week or longer. CSW continues to follow case and update [facility] it pt is unable to return; SNF search will need to be initiated if [facility] is unable to accommodate pt needs when he is medically ready for discharge.   Maryclare Labrador, MSW, [facility] Clinical Social Worker [phone]

## 2013-06-11 NOTE — Progress Notes (Signed)
CSW called [facility] and left message for admissions rep asking if they can take pt back on 100% BiPap. If not, CSW will ask if they can take pt on trach.    Maryclare Labrador, MSW, [facility] Clinical Social Worker [phone]

## 2013-06-11 NOTE — Progress Notes (Signed)
Chaplain consulted with the patient's nurse. Nursing advised the chaplain to see the patient another time. Per the patient's nurse, he was just given medicine to help him rest and has had visitors in and out most of the morning. Patient's nurse told the chaplain it was not a good time to visit with the patient.   06/11/13 1300  Clinical Encounter Type  Visited With Health care provider  Visit Type Initial

## 2013-06-11 NOTE — Progress Notes (Signed)
NUTRITION FOLLOW UP  INTERVENTION:  Continue Ensure Complete PO BID, each supplement provides 350 kcal and 13 grams of protein  Once feeding tube placed will start Osmolite 1.2 @ 20 ml/hr increasing by 10 ml every 12 hours to goal rate of 70 ml/hr.  Monitor magnesium, potassium, and phosphorus daily for at least 3 days, MD to replete as needed, as pt is at risk for refeeding syndrome given poor nutrition for the last 11 days.   NUTRITION DIAGNOSIS: Increased nutrient needs related to multiple wounds as evidenced by estimated nutrition needs, ongoing  Goal: Intake to meet >90% of estimated nutrition needs, unmet  Monitor:  PO intake, goals of care, labs, weight trend  ASSESSMENT: Patient is a 27 yo C4 quadriplegic resident af a SNF admitted with worsening dyspnea. Chest imaging revealed complete R lung collapse. Intubated for bronchoscopy.   Patient extubated 12/5.  Currently on bi-pap. Continues on a Full Liquid diet.  Per RN, PO intake remains poor, however, drinking some of his Ensure Complete supplements. Family meetings are ongoing to determine goals of care.  Per RN plan changes frequently.  Consult received to manage TF. Per RN pt will not be getting a feeding tube placed today due to pt feeling terrible. She will continue to offer ensure throughout the day. Plan is for pt to receive trach tomorrow and then have a NG placed for feeding.  Pt has had poor nutrition since admission 12/4 and is at risk for refeeding syndrome. Potassium is low and being repleted.    Height: Ht Readings from Last 1 Encounters:  06-24-2013 5' 10.08" (1.78 m)    Weight: Wt Readings from Last 1 Encounters:  06/10/13 216 lb 0.8 oz (98 kg)  182 lb (82.5 kg) 8/14  BMI:  Body mass index is 30.93 kg/(m^2).  Re-estimated Needs: Kcal: 2100-2300 Protein: 115-130 gm Fluid: 2.2-2.4 L  Skin: stage II pressure ulcer on sacrum; deep tissue injury to heel; stage I pressure ulcer to elbow  Diet Order:  Full Liquid   Intake/Output Summary (Last 24 hours) at 06/11/13 1549 Last data filed at 06/11/13 1400  Gross per 24 hour  Intake   1520 ml  Output    450 ml  Net   1070 ml   BM: colostomy output noted 12/15  Labs:   Recent Labs Lab 06/06/13 0500 06/07/13 0448 06/11/13 0615  NA 140 142 145  K 3.5 3.4* 3.0*  CL 104 105 107  CO2 27 27 28   BUN 6 6 7   CREATININE 0.20* 0.22* 0.30*  CALCIUM 8.5 8.6 8.3*  GLUCOSE 81 83 92    CBG (last 3)  No results found for this basename: GLUCAP,  in the last 72 hours  Scheduled Meds: . albuterol  2.5 mg Nebulization Q6H  . antiseptic oral rinse  15 mL Mouth Rinse QID  . ARIPiprazole  10 mg Oral Daily  . baclofen  10 mg Oral TID  . benztropine  0.5 mg Oral BID  . chlorhexidine  15 mL Mouth Rinse BID  . clonazePAM  2 mg Oral BID  . dabigatran  150 mg Oral BID  . feeding supplement (ENSURE COMPLETE)  237 mL Oral BID BM  . FLUoxetine  40 mg Oral Daily  . gabapentin  800 mg Oral TID  . oxybutynin  5 mg Oral BID  . potassium chloride  10 mEq Intravenous Q1 Hr x 4  . QUEtiapine  400 mg Oral QHS  . valproic acid  750 mg Oral  BID  . vancomycin  1,250 mg Intravenous Q8H    Continuous Infusions: . sodium chloride 20 mL/hr at 06/11/13 0601    [facility] RD, LDN, CNSC [phone] Pager [phone] After Hours Pager

## 2013-06-11 NOTE — Progress Notes (Signed)
Patient WU:[id number] Alan Sanford      DOB: [birth year]      YNW:[id number]  Extensive conversation with family, and physician.  Will return this evening to talk with Jill Alexanders. He was too fatigued to reason.  All parties satisfied with this plan  Sya Nestler L. Ladona Ridgel, MD MBA The Palliative Medicine Team at [facility] Phone: [phone] Pager: [phone]

## 2013-06-12 ENCOUNTER — Inpatient Hospital Stay (HOSPITAL_COMMUNITY): Payer: Federal, State, Local not specified - PPO

## 2013-06-12 LAB — POCT I-STAT 3, ART BLOOD GAS (G3+)
Acid-Base Excess: 1 mmol/L (ref 0.0–2.0)
Bicarbonate: 26.1 mEq/L — ABNORMAL HIGH (ref 20.0–24.0)
O2 Saturation: 99 %
pCO2 arterial: 40.7 mmHg (ref 35.0–45.0)
pO2, Arterial: 155 mmHg — ABNORMAL HIGH (ref 80.0–100.0)

## 2013-06-12 LAB — BASIC METABOLIC PANEL
Calcium: 8.2 mg/dL — ABNORMAL LOW (ref 8.4–10.5)
Chloride: 109 mEq/L (ref 96–112)
Creatinine, Ser: 0.42 mg/dL — ABNORMAL LOW (ref 0.50–1.35)
GFR calc Af Amer: >90 mL/min (ref >90)
GFR calc non Af Amer: >90 mL/min (ref >90)
Glucose, Bld: 96 mg/dL (ref 70–99)
Potassium: 3.2 mEq/L — ABNORMAL LOW (ref 3.5–5.1)
Sodium: 145 mEq/L (ref 135–145)

## 2013-06-12 LAB — CBC
Hemoglobin: 11.2 g/dL — ABNORMAL LOW (ref 13.0–17.0)
MCH: 32.7 pg (ref 26.0–34.0)
MCHC: 32.7 g/dL (ref 30.0–36.0)
RDW: 14.9 % (ref 11.5–15.5)

## 2013-06-12 MED ORDER — PANTOPRAZOLE SODIUM 40 MG IV SOLR
40.0000 mg | INTRAVENOUS | Status: DC
  Administered 2013-06-12 – 2013-06-13 (×2): 40 mg via INTRAVENOUS
  Filled 2013-06-12 (×4): qty 40

## 2013-06-12 MED ORDER — VALPROIC ACID 250 MG/5ML PO SYRP
750.0000 mg | ORAL_SOLUTION | Freq: Two times a day (BID) | ORAL | Status: DC
Start: 2013-06-12 — End: 2013-06-14
  Administered 2013-06-12 – 2013-06-14 (×4): 750 mg
  Filled 2013-06-12 (×5): qty 15

## 2013-06-12 MED ORDER — BIOTENE DRY MOUTH MT LIQD: 1.0000 "application " | Freq: Four times a day (QID) | OROMUCOSAL | Status: DC

## 2013-06-12 MED ORDER — MIDAZOLAM HCL 2 MG/2ML IJ SOLN
INTRAMUSCULAR | Status: AC
  Administered 2013-06-12: 4 mg
  Filled 2013-06-12: qty 2

## 2013-06-12 MED ORDER — MIDAZOLAM HCL 2 MG/2ML IJ SOLN
INTRAMUSCULAR | Status: AC
  Administered 2013-06-12: 2 mg
  Filled 2013-06-12: qty 2

## 2013-06-12 MED ORDER — MIDAZOLAM HCL 2 MG/2ML IJ SOLN
INTRAMUSCULAR | Status: AC
  Filled 2013-06-12: qty 2

## 2013-06-12 MED ORDER — FENTANYL CITRATE 0.05 MG/ML IJ SOLN
INTRAMUSCULAR | Status: AC
  Administered 2013-06-12: 100 ug
  Filled 2013-06-12: qty 2

## 2013-06-12 MED ORDER — ETOMIDATE 2 MG/ML IV SOLN
20.0000 mg | Freq: Once | INTRAVENOUS | Status: AC
  Administered 2013-06-12: 20 mg via INTRAVENOUS

## 2013-06-12 MED ORDER — FENTANYL CITRATE 0.05 MG/ML IJ SOLN
25.0000 ug/h | INTRAMUSCULAR | Status: DC
  Administered 2013-06-12: 200 ug/h via INTRAVENOUS
  Administered 2013-06-13: 150 ug/h via INTRAVENOUS
  Filled 2013-06-12 (×2): qty 50

## 2013-06-12 MED ORDER — CHLORHEXIDINE GLUCONATE 0.12 % MT SOLN: 15.0000 mL | Freq: Two times a day (BID) | OROMUCOSAL | Status: DC

## 2013-06-12 MED ORDER — FENTANYL CITRATE 0.05 MG/ML IJ SOLN
INTRAMUSCULAR | Status: AC
  Filled 2013-06-12: qty 2

## 2013-06-12 MED ORDER — POTASSIUM CHLORIDE 20 MEQ/15ML (10%) PO LIQD: 40.0000 meq | ORAL | Status: AC

## 2013-06-12 NOTE — Progress Notes (Signed)
Name: Alan Sanford MRN: [id number] DOB: [birth year]   PROCEDURE NOTE  Procedure:  Endotracheal intubation.  Indication:  Acute respiratory failure  Consent:  Consent was implied due to the emergency nature of the procedure.  Anesthesia:  A total of 10 mg of Etomidate was given intravenously.  Procedure summary:  Appropriate equipment was assembled. The patient was identified as Alan Sanford and safety timeout was performed. The patient was placed supine, with head in sniffing position. After adequate level of anesthesia was achieved, a GS#3 blade was inserted into the oropharynx and the vocal cords were visualized. A 8.0 endotracheal tube was inserted without difficulty and visualized going through the vocal cords. The stylette was removed and cuff inflated. Colorimetric change was noted on the CO2 meter. Breath sounds were heard over both lung fields equally. ETT was secured at 24 lip line.  Post procedure chest xray was ordered.  Complications:  No immediate complications were noted.  Hemodynamic parameters and oxygenation remained stable throughout the procedure.    Orlean Bradford, M.D. Pulmonary and Critical Care Medicine [facility] Pager: [phone]  06/12/2013, 12:22 PM

## 2013-06-12 NOTE — Progress Notes (Signed)
eLink Physician-Brief Progress Note Patient Name: Alan Sanford DOB: 1985/08/28 MRN: [id number]  Date of Service  06/12/2013   HPI/Events of Note  Hypokalemia  eICU Interventions  Potassium replaced   Intervention Category Minor Interventions: Electrolytes abnormality - evaluation and management  [name] 06/12/2013, 6:55 AM

## 2013-06-12 NOTE — Progress Notes (Signed)
Patient ZO:XWRUEA L Benko      DOB: 1985-09-19      VWU:[id number]   Received call from Viggo's second in line power of attorney on behalf of his step mom [name].  Noreene Larsson wanted [place] to know that they  Luster Landsberg and Noreene Larsson ) would not be supporting long term trach as he has expressed the desire to the pulmonary doctors.  Michae has been too confused to be comfortable with any decision making.  Several minutes after conveying this information to CCM patient had a respiratory arrest requiring intubation.  I took on communicating with the patient's family about the event so that Dr. Herma Carson could attend to the patient.  Updated all parties Dorthula Nettles and [address].  POA continue to have opposing thoughts as to what is good for [place].  Called to arrange Ethics consult with Dr. Herma Carson blessing.  Discussed limited code option with all parties Luster Landsberg would support no CPR if [facility] coded on Vent as would [place].  Dawn not supportive of limited code states. She asked me to speak with her spouse who updated me on their religious belief that life is precious and they are choosing life for [place] at all costs.  Thanked him for his update and asked them to participate in meeting with Ethics, and Palliative care.  Dr. Herma Carson to attend when finished with procedure.  For tonight all agree [place] will rest post bronch as full code.  Updated Hoyt's Dad, Noreene Larsson who were accompanied by [name]'s sister who has worked for [employer] outside [employer].  Subsequently, late afternoon received call back from Ethics team.  Will plan meeting for 830 am.  All parties will meet at the room that Samuel is in and I will arrange a meeting place.     Demetrias Goodbar L. Ladona Ridgel, MD MBA The Palliative Medicine Team at [facility] Phone: [phone] Pager: [phone]

## 2013-06-12 NOTE — Progress Notes (Signed)
eLink Physician-Brief Progress Note Patient Name: SAVON BORDONARO DOB: [birth year] MRN: [id number]  Date of Service  06/12/2013   HPI/Events of Note   SUP  eICU Interventions  Protonix ordered, please evaluate if C. Diff is a risk may need to change to pepcid.      YACOUB,WESAM 06/12/2013, 3:09 PM

## 2013-06-12 NOTE — Progress Notes (Signed)
Patient ZO:XWRUEA Alan Sanford      DOB: [birth year]      VWU:[id number]   Palliative Medicine Team at [facility] Progress Note    Subjective:  Patient remains intermittently confused.  When he is a peak awareness and after being on bipap most of the afternoon.  I revisited with his mother Alan Sanford on the phone.  He remains inconsistent with his decision making. It will be important for patient and family to understand how feeding will occur if trach replaced.  The patient has stated his would not want feeding tube.  Primary POA not supporting completely the placement of trach but also will not commit to complete comfort without Alan Sanford's agreement.  Patient's Aunt asking to trach and would even consider PEG, not willing to accept full comfort yet.     Filed Vitals:   06/12/13 0600  BP: 99/53  Pulse: 78  Temp:   Resp: 13   Physical exam:  General: confused and somnolent. PERRL, EOMI, anciteric Chest decreased, no wheeze CVS; regular S1, S@ ABd Obese Ext ; quadraparesis Neuro: confused.     Assessment and plan: 27 yr old white male s/p suicide attempt resulting in quadraparesis.  Patient admitted with respiratory failure persistent plugging. Pulmonary recommending replace trach to improve comfort and help with future difficulties. Patient with limited capacity due to confusion.  Intense psychosocial issues.  Family wanting Alan Sanford's imput but I have been honest that he likely does not have full capacity and so decision need to be supported and made by POAs who are disagree.  Alan Sanford is not necessary nonpalliative.  Patient deserves full comfort but patient is clear that he is not quite ready for that.    1.  FULL code reversed by patient with family support 2.  Respiratory failure will continue to talk with Alan Sanford and obtain information about feeding with trach  3. Continue curative treatment   Total time:   40 min  Alan Rylee L. Ladona Ridgel, MD MBA The Palliative Medicine Team at [facility] Phone: [phone] Pager: [phone]

## 2013-06-12 NOTE — Progress Notes (Addendum)
PULMONARY  / CRITICAL CARE MEDICINE  Name: Alan Sanford MRN: [id number] DOB: 1986/06/08    ADMISSION DATE:  06/10/13 CONSULTATION DATE: 06/10/2013  REFERRING MD : EDP  PRIMARY SERVICE: PCCM   CHIEF COMPLAINT: Acute respiratory failure   BRIEF PATIENT DESCRIPTION: 27 yo C4 quadriplegic. SNF resident admitted with progressive dyspnea. Chest imaging revealed complete R lung collapse. Intubated for bronchoscopy.   SIGNIFICANT EVENTS / STUDIES:  12/04 Admitted with acute respiratory failure, failed BiPAP  12/04 CTA chest: R lung collapse / occlusion of mainstem bronchus  12/04 Bronchoscopy >>> Copious amounts of purulent secretions aspirated from R lung  12/05 Extubated  12/06 Family discussion >>> Aggressive medical management, but DNI/DNR and palliation if worse  12/14 Palliative care consulted >>> Full Code, trach OK  12/15 Respiratory distress, intubated, transferred to ICU  LINES / TUBES:  ETT 12/04 >>> 12/05; 12/16 >>> OGT 12/16 >>> R [place] midline - chronic  Foley - chronic   CULTURES:  12/04 Blood >> Neg 12/04 Urine >>> multiple morphotypes  12/04 Respiratory >> MRSA   ANTIBIOTICS:  Aztreonam 12/04 >> 12/10  Vanc 12/04 >>>  Levoflox 12/10 >>> 12/15  INTERVAL HISTORY:  VITAL SIGNS: Temp:  [97.3 F (36.3 C)-98.6 F (37 C)] 98.4 F (36.9 C) (12/16 0821) Pulse Rate:  [78-87] 78 (12/16 0821) Resp:  [11-25] 14 (12/16 0821) BP: (89-103)/(50-56) 89/51 mmHg (12/16 0821) SpO2:  [91 %-98 %] 98 % (12/16 0821) FiO2 (%):  [60 %-100 %] 100 % (12/16 0821) Weight:  [98 kg (216 lb 0.8 oz)] 98 kg (216 lb 0.8 oz) (12/16 0403) HEMODYNAMICS:   VENTILATOR SETTINGS: Vent Mode:  [-] BIPAP FiO2 (%):  [60 %-100 %] 100 % Set Rate:  [10 bmp] 10 bmp INTAKE / OUTPUT: Intake/Output     12/15 0701 - 12/16 0700 12/16 0701 - 12/17 0700   P.O. 180 120   I.V. (mL/kg) 380 (3.9) 80 (0.8)   IV Piggyback 750    Total Intake(mL/kg) 1310 (13.4) 200 (2)   Urine (mL/kg/hr) 800 (0.3)    Stool  75 (0)    Total Output 875     Net +435 +200          PHYSICAL EXAMINATION: General:  Mechanically ventilated, synchronous Neuro:  Encephalopathic, nonfocal, cough / gag diminished HEENT:  PERRL, OETT, facial abrasions Cardiovascular:  RRR, no m/r/g Lungs:  Bilateral rhonchi Abdomen:  Soft, nontender, bowel sounds diminished Musculoskeletal:  Contractures  Skin:  No rash  LABS: CBC  Recent Labs Lab 06/07/13 0448 06/11/13 0615 06/12/13 0430  WBC 9.3 12.1* 10.0  HGB 12.3* 11.8* 11.2*  HCT 36.8* 36.2* 34.2*  PLT 219 219 195   Coag's No results found for this basename: APTT, INR,  in the last 168 hours BMET  Recent Labs Lab 06/07/13 0448 06/11/13 0615 06/12/13 0430  NA 142 145 145  K 3.4* 3.0* 3.2*  CL 105 107 109  CO2 27 28 27   BUN 6 7 9   CREATININE 0.22* 0.30* 0.42*  GLUCOSE 83 92 96   Electrolytes  Recent Labs Lab 06/07/13 0448 06/11/13 0615 06/12/13 0430  CALCIUM 8.6 8.3* 8.2*   Sepsis Markers No results found for this basename: LATICACIDVEN, PROCALCITON, O2SATVEN,  in the last 168 hours ABG No results found for this basename: PHART, PCO2ART, PO2ART,  in the last 168 hours Liver Enzymes No results found for this basename: AST, ALT, ALKPHOS, BILITOT, ALBUMIN,  in the last 168 hours Cardiac Enzymes No results found for this basename:  TROPONINI, PROBNP,  in the last 168 hours Glucose  Recent Labs Lab 06/05/13 1546  GLUCAP 91   CXR:  12/16 >>>  ASSESSMENT / PLAN:  PULMONARY  A: Acute on chronic resp failure. Pneumonia. Recurrent R lung collapse, better now on positive pressure ventilation.  Mucus plug, recurrent.  P: Goal pH>7.30, SpO2>92 Continuous mechanical support VAP bundle Daily SBT Trend ABG/CXR Albuterol Chect PT - Vest  Tracheostomy pending family decision as likely needs intermittent / nocturnal ventilatory support   CARDIOVASCULAR  A: Chronic hypotension.  P:  Goal SBP>90  RENAL  A: Chronic Foley. Hypokalemia.  P:   Trend BMP  K 40 x 2  GASTROINTESTINAL  A: Dysphagia. Protein calorie malnutrition.  P:  NPO  TF per Nutritionist  May need PEG   HEMATOLOGIC  A: Chronic anticoagulation for multiple VTE.  P:  Trend CBC  Hold Pradaxa for possible tracheostomy 12/17  INFECTIOUS  A: MRSA PNA  P:  Continue Vancomycin x 14 days   ENDOCRINE  A: No active issues.  P:  No intervention required   NEUROLOGIC  A: C4 quadriplegia. Bipolar disorder.  P:  Baclofen, Cogentin, Klonopin, Neurontin, Valproic acid  Abilify, Prozac, Seroquel  D/c Morphine, Oxycodone Fentanyl gtt  Discussed in length with palliative care. Goals of care unclear as patient appears to have limited capacity of making healthcare decisions and there is no consensus among 3 medical POAs. Likely needs goals of care meeting involving POAs, PCCM, Palliative Care and possibly ethics committee.  I have personally obtained history, examined patient, evaluated and interpreted laboratory and imaging results, reviewed medical records, formulated assessment / plan and placed orders.  CRITICAL CARE:  The patient is critically ill with multiple organ systems failure and requires high complexity decision making for assessment and support, frequent evaluation and titration of therapies, application of advanced monitoring technologies and extensive interpretation of multiple databases. Critical Care Time devoted to patient care services described in this note is 45 minutes.   Lonia Farber, MD Pulmonary and Critical Care Medicine [facility] Pager: [phone]  06/12/2013, 11:45 AM

## 2013-06-12 NOTE — Progress Notes (Signed)
Pt more lethargic than yesterday. No N.G. Tube placed per request of P.O.A.. Attempted to give AM pills to pt. NTT suctioning started to attempt to raise Sats. Pt not responding to interventions. Intubated at bedside and [facility] to [facility].

## 2013-06-12 NOTE — Progress Notes (Signed)
Pt intubated and transferred to 2H. CSW continuing to follow case.   Maryclare Labrador, MSW, [facility] Clinical Social Worker [phone]

## 2013-06-12 NOTE — Progress Notes (Signed)
Return call from [facility] and Abbie Sons. Both updated on patients condition.

## 2013-06-12 NOTE — Progress Notes (Signed)
Attempted to contact Alan Sanford @ [phone] attempt to call twice message left Attempted to call Alan Sanford @ [phone] message left.

## 2013-06-12 NOTE — Progress Notes (Signed)
Chaplain went to see pt at doctor's request.  Pt was not available but chaplain did speak with ex-fiancee and aunt.  They seemed aggravated with some of the care decisions that had been made recently for the pt.  Chaplain attempted to listen and provide a sense of spiritual support.

## 2013-06-12 NOTE — Procedures (Signed)
Name:  KYLIE GROS MRN:  [id number] DOB:  Jan 21, 1986  PROCEDURE NOTE  Procedure:  Therapeutic bronchoscopy (11914)  Indications:  Lung collapse  Consent:  Emergent  Anesthesia:  Already sedated for mechanical ventilation.   Procedure summary:  Appropriate equipment was assembled.  The patient was identified as Lucius Conn.  Safety timeout was performed. The patient was placed supine and adequate level of sedation was assured.  Flexible bronchoscope was lubricated and inserted via the endotracheal tube.  Airway examination was performed bilaterally to subsegmental level.  Left lung airway appeared normal. Right mainstem bronchus was completely occluded by mucus plug.  Large amount of thick secretions was aspirated.  Specimens sent: None.  Complications:  No immediate complications were noted.  Hemodynamic parameters and oxygenation remained stable throughout the procedure.  Estimated blood loss:  Less then 5 mL.  Orlean Bradford, M.D. Pulmonary and Critical Care Medicine [facility] Pager: [phone]  06/12/2013, 2:03 PM

## 2013-06-12 NOTE — Significant Event (Signed)
Rapid Response Event Note  Overview: Time Called: 1131 Arrival Time: 1135    Initial Focused Assessment: Called to patient's bedside to assist with intubation. Dr Marin Shutter bagging patient via BVM,  RN has given Versed IVP.   Interventions: 20 mg Etomidate given IVP via PICC Dr Herma Carson. Intubated patient without difficulty. OG tube placed, positive air bolus, gastric contents. Patient transported to 2H05 via bed with O2 via Vent, and portable monitor with HR, BP, RR, O2 sat 2H RNs at bedside to receive patient.  Event Summary: Name of Physician Notified: DR Zubelevitskiy at Bedside at      at    Outcome: Transferred (Comment) [phone])  Event End Time: 1227  Alan Sanford

## 2013-06-13 ENCOUNTER — Encounter (HOSPITAL_COMMUNITY): Payer: Federal, State, Local not specified - PPO

## 2013-06-13 ENCOUNTER — Inpatient Hospital Stay (HOSPITAL_COMMUNITY): Payer: Federal, State, Local not specified - PPO

## 2013-06-13 DIAGNOSIS — Z66 Do not resuscitate: Secondary | ICD-10-CM | POA: Diagnosis present

## 2013-06-13 LAB — BASIC METABOLIC PANEL
BUN: 7 mg/dL (ref 6–23)
Chloride: 109 mEq/L (ref 96–112)
Creatinine, Ser: 0.4 mg/dL — ABNORMAL LOW (ref 0.50–1.35)
Glucose, Bld: 98 mg/dL (ref 70–99)
Potassium: 2.9 mEq/L — ABNORMAL LOW (ref 3.5–5.1)

## 2013-06-13 LAB — VANCOMYCIN, TROUGH: Vancomycin Tr: 34.4 ug/mL (ref 10.0–20.0)

## 2013-06-13 LAB — CBC
HCT: 33.8 % — ABNORMAL LOW (ref 39.0–52.0)
Hemoglobin: 11.4 g/dL — ABNORMAL LOW (ref 13.0–17.0)
MCV: 96.3 fL (ref 78.0–100.0)
RBC: 3.51 MIL/uL — ABNORMAL LOW (ref 4.22–5.81)
WBC: 8.8 10*3/uL (ref 4.0–10.5)

## 2013-06-13 LAB — PHOSPHORUS: Phosphorus: 2.6 mg/dL (ref 2.3–4.6)

## 2013-06-13 LAB — MAGNESIUM: Magnesium: 1.8 mg/dL (ref 1.5–2.5)

## 2013-06-13 MED ORDER — POTASSIUM CHLORIDE 20 MEQ/15ML (10%) PO LIQD
40.0000 meq | ORAL | Status: AC
  Administered 2013-06-13 (×2): 40 meq
  Filled 2013-06-13 (×2): qty 30

## 2013-06-13 MED ORDER — FREE WATER
200.0000 mL | Freq: Three times a day (TID) | Status: DC
  Administered 2013-06-13 – 2013-06-14 (×3): 200 mL

## 2013-06-13 MED ORDER — VANCOMYCIN HCL IN DEXTROSE 1-5 GM/200ML-% IV SOLN
1000.0000 mg | Freq: Two times a day (BID) | INTRAVENOUS | Status: DC
  Administered 2013-06-13 – 2013-06-14 (×2): 1000 mg via INTRAVENOUS
  Filled 2013-06-13 (×5): qty 200

## 2013-06-13 MED ORDER — FENTANYL CITRATE 0.05 MG/ML IJ SOLN: 50.0000 ug | INTRAMUSCULAR | Status: DC | PRN

## 2013-06-13 MED ORDER — POTASSIUM CHLORIDE 20 MEQ/15ML (10%) PO LIQD
ORAL | Status: AC
  Administered 2013-06-13: 40 meq
  Filled 2013-06-13: qty 30

## 2013-06-13 NOTE — Progress Notes (Signed)
NUTRITION FOLLOW UP  INTERVENTION: 1.  Enteral nutrition;  Osmolite 1.2 @ 20 ml/hr increasing by 10 ml every 12 hours to goal rate of 70 ml/hr.  Continue if pt survives extubation. Nutrition has been poor since admission.  2.  Nutrition-related labs; Monitor magnesium, potassium, and phosphorus daily for at least 3 days, MD to replete as needed, as pt is at risk for refeeding syndrome given poor nutrition for the last 11 days.   NUTRITION DIAGNOSIS: Increased nutrient needs related to multiple wounds as evidenced by estimated nutrition needs, ongoing  Goal: Intake to meet >90% of estimated nutrition needs, unmet  Monitor:  PO intake, goals of care, labs, weight trend  ASSESSMENT: Patient is a 27 yo C4 quadriplegic resident af a SNF admitted with worsening dyspnea. Chest imaging revealed complete R lung collapse. Intubated for bronchoscopy.   Pt with complicated medical course due to respiratory failure.  Pt with change in status; now intubated with ETT.  Was not able to obtain trach.  Patient is currently intubated on ventilator support.  MV: 11.3 L/min Temp (24hrs), Avg:98 F (36.7 C), Min:96.3 F (35.7 C), Max:98.9 F (37.2 C)  Propofol: none  Currently receiving Vital 1.2 @ 30 mL/hr,  which provides 864 kcal, 39g protein, 590 mL free water.  Pt has had poor nutrition since admission 12/4 and is at risk for refeeding syndrome. Potassium is low and being repleted.  Phos and Mg were appropriate this AM.   Residuals: 0 mL  Plans for extubation today.  Comfort care if he deteriorates per MD note.   Height: Ht Readings from Last 1 Encounters:  06/12/13 5\' 10"  (1.778 m)    Weight: Wt Readings from Last 1 Encounters:  06/12/13 216 lb 0.8 oz (98 kg)  182 lb (82.5 kg) 8/14  BMI:  Body mass index is 31 kg/(m^2).  Re-estimated Needs: Kcal: 2078 Protein: 115-130 gm Fluid: 2.2-2.4 L  Skin: stage II pressure ulcer on sacrum; deep tissue injury to heel; stage I pressure ulcer  to elbow  Diet Order: NPO   Intake/Output Summary (Last 24 hours) at 06/13/13 1059 Last data filed at 06/13/13 1000  Gross per 24 hour  Intake   1558 ml  Output   1610 ml  Net    -52 ml   BM: colostomy output noted 12/16  Labs:   Recent Labs Lab 06/11/13 0615 06/12/13 0430 06/13/13 0405 06/13/13 0809  NA 145 145  --  146*  K 3.0* 3.2*  --  2.9*  CL 107 109  --  109  CO2 28 27  --  25  BUN 7 9  --  7  CREATININE 0.30* 0.42*  --  0.40*  CALCIUM 8.3* 8.2*  --  8.1*  MG  --   --  1.8  --   PHOS  --   --  2.6  --   GLUCOSE 92 96  --  98    CBG (last 3)  No results found for this basename: GLUCAP,  in the last 72 hours  Scheduled Meds: . albuterol  2.5 mg Nebulization Q6H  . antiseptic oral rinse  15 mL Mouth Rinse QID  . ARIPiprazole  10 mg Oral Daily  . baclofen  10 mg Oral TID  . benztropine  0.5 mg Oral BID  . chlorhexidine  15 mL Mouth Rinse BID  . clonazePAM  2 mg Oral BID  . dabigatran  150 mg Oral BID  . FLUoxetine  40 mg Oral Daily  .  free water  200 mL Per Tube Q8H  . gabapentin  800 mg Oral TID  . oxybutynin  5 mg Oral BID  . pantoprazole (PROTONIX) IV  40 mg Intravenous Q24H  . potassium chloride  40 mEq Per Tube Q4H  . QUEtiapine  400 mg Oral QHS  . Valproic Acid  750 mg Per Tube BID    Continuous Infusions: . sodium chloride 20 mL/hr at 06/12/13 2000  . feeding supplement (OSMOLITE 1.2 CAL) 1,000 mL (06/13/13 0953)    Loyce Dys, MS RD LDN Clinical Inpatient Dietitian Pager: [phone] Weekend/After hours pager: [phone]

## 2013-06-13 NOTE — Progress Notes (Signed)
ANTIBIOTIC CONSULT NOTE - FOLLOW UP  Pharmacy Consult for vancomycin Indication: MRSA PNA  Allergies  Allergen Reactions  . Sulfa Antibiotics Anaphylaxis    Unknown   . Cefepime Other (See Comments)    Itching (pt states he can take)  . Xarelto [Rivaroxaban] Other (See Comments)    Acute Mental Status changes    Patient Measurements: Height: 5\' 10"  (177.8 cm) Weight: 216 lb 0.8 oz (98 kg) IBW/kg (Calculated) : 73   Vital Signs: Temp: 99.9 F (37.7 C) (12/17 1600) Temp src: Oral (12/17 1600) BP: 106/56 mmHg (12/17 2000) Pulse Rate: 95 (12/17 2000) Intake/Output from previous day: 12/16 0701 - 12/17 0700 In: 1490.5 [P.O.:120; I.V.:665.8; NG/GT:204.7; IV Piggyback:500] Out: 1510 [Urine:1350; Emesis/NG output:160] Intake/Output from this shift:    Labs:  Recent Labs  06/11/13 0615 06/12/13 0430 06/13/13 0809  WBC 12.1* 10.0 8.8  HGB 11.8* 11.2* 11.4*  PLT 219 195 220  CREATININE 0.30* 0.42* 0.40*   Estimated Creatinine Clearance: 165.7 ml/min (by C-G formula based on Cr of 0.4).  Recent Labs  06/13/13 0837 06/13/13 2045  VANCOTROUGH 34.4*  --   VANCORANDOM  --  18.6     Microbiology: Recent Results (from the past 720 hour(s))  CULTURE, BLOOD (ROUTINE X 2)     Status: None   Collection Time    15-Jun-2013  9:08 AM      Result Value Range Status   Specimen Description BLOOD CENTRAL LINE CHEST RIGHT   Final   Special Requests BOTTLES DRAWN AEROBIC ONLY 10CC   Final   Culture  Setup Time     Final   Value: 06/11/2013 14:31     Performed at [facility]   Culture     Final   Value: NO GROWTH 5 DAYS     Performed at [facility]   Report Status 06/06/2013 FINAL   Final  CULTURE, BLOOD (ROUTINE X 2)     Status: None   Collection Time    06/21/2013 10:25 AM      Result Value Range Status   Specimen Description BLOOD FOOT RIGHT   Final   Special Requests BOTTLES DRAWN AEROBIC ONLY 2CC   Final   Culture  Setup Time     Final   Value:  15-Jun-2013 14:31     Performed at [facility]   Culture     Final   Value: NO GROWTH 5 DAYS     Performed at [facility]   Report Status 06/06/2013 FINAL   Final  URINE CULTURE     Status: None   Collection Time    06/18/2013 12:55 PM      Result Value Range Status   Specimen Description URINE, CATHETERIZED   Final   Special Requests NONE   Final   Culture  Setup Time     Final   Value: 06/27/2013 14:02     Performed at [facility] Count     Final   Value: >=100,000 COLONIES/ML     Performed at [facility]   Culture     Final   Value: Multiple bacterial morphotypes present, none predominant. Suggest appropriate recollection if clinically indicated.     Performed at [facility]   Report Status 06/01/2013 FINAL   Final  CULTURE, RESPIRATORY (NON-EXPECTORATED)     Status: None   Collection Time    June 15, 2013  2:42 PM      Result Value Range  Status   Specimen Description TRACHEAL ASPIRATE   Final   Special Requests NONE   Final   Gram Stain     Final   Value: MODERATE WBC PRESENT,BOTH PMN AND MONONUCLEAR     NO SQUAMOUS EPITHELIAL CELLS SEEN     RARE GRAM POSITIVE COCCI IN PAIRS     Performed at [facility]   Culture     Final   Value: MODERATE METHICILLIN RESISTANT STAPHYLOCOCCUS AUREUS     Note: RIFAMPIN AND GENTAMICIN SHOULD NOT BE USED AS SINGLE DRUGS FOR TREATMENT OF STAPH INFECTIONS. This organism is presumed to be Clindamycin resistant based on detection of inducible Clindamycin resistance. CRITICAL RESULT CALLED TO, READ BACK BY AND      VERIFIED WITH: SABO [facility] RN 10AM 06/03/13 GUSTK     Performed at [facility]   Report Status 06/03/2013 FINAL   Final   Organism ID, Bacteria METHICILLIN RESISTANT STAPHYLOCOCCUS AUREUS   Final  MRSA PCR SCREENING     Status: Abnormal   Collection Time    06/22/13  2:46 PM      Result Value Range Status   MRSA by PCR POSITIVE (*) NEGATIVE Final   Comment:             The GeneXpert MRSA Assay (FDA     approved for NASAL specimens     only), is one component of a     comprehensive MRSA colonization     surveillance program. It is not     intended to diagnose MRSA     infection nor to guide or     monitor treatment for     MRSA infections.     RESULT CALLED TO, READ BACK BY AND VERIFIED WITH:     A. BECK RN 16:15 06-22-13 ([email or web address])  CULTURE, RESPIRATORY (NON-EXPECTORATED)     Status: None   Collection Time    06/12/13 11:40 AM      Result Value Range Status   Specimen Description TRACHEAL ASPIRATE   Final   Special Requests NONE   Final   Gram Stain     Final   Value: ABUNDANT WBC PRESENT, PREDOMINANTLY PMN     RARE SQUAMOUS EPITHELIAL CELLS PRESENT     NO ORGANISMS SEEN     Performed at [facility]   Culture     Final   Value: NO GROWTH 1 DAY     Performed at [facility]   Report Status PENDING   Incomplete    Anti-infectives   Start     Dose/Rate Route Frequency Ordered Stop   06/06/13 1130  levofloxacin (LEVAQUIN) tablet 750 mg  Status:  Discontinued     750 mg Oral Daily 06/06/13 1041 06/11/13 1238   06/05/13 2100  vancomycin (VANCOCIN) 1,250 mg in sodium chloride 0.9 % 250 mL IVPB  Status:  Discontinued     1,250 mg 166.7 mL/hr over 90 Minutes Intravenous Every 8 hours 06/05/13 1406 06/13/13 1038   06/03/13 2100  vancomycin (VANCOCIN) IVPB 1000 mg/200 mL premix  Status:  Discontinued     1,000 mg 200 mL/hr over 60 Minutes Intravenous Every 8 hours 06/03/13 1203 06/05/13 1406   06/03/13 1300  vancomycin (VANCOCIN) 1,750 mg in sodium chloride 0.9 % 500 mL IVPB     1,750 mg 250 mL/hr over 120 Minutes Intravenous  Once 06/03/13 1203 06/03/13 1452   06/22/2013 2200  linezolid (ZYVOX) IVPB 600 mg  Status:  Discontinued  600 mg 300 mL/hr over 60 Minutes Intravenous Every 12 hours 06-11-13 1004 06/03/13 1155   2013/06/11 1400  aztreonam (AZACTAM) 1 g in dextrose 5 % 50 mL IVPB  Status:  Discontinued     1 g 100 mL/hr over  30 Minutes Intravenous 3 times per day 06/11/13 1011 06/06/13 1041   11-Jun-2013 1015  linezolid (ZYVOX) IVPB 600 mg     600 mg 300 mL/hr over 60 Minutes Intravenous STAT 06-11-2013 1003 06-11-2013 1355   2013/06/11 0930  aztreonam (AZACTAM) 2 g in dextrose 5 % 50 mL IVPB     2 g 100 mL/hr over 30 Minutes Intravenous  Once 06/11/13 0915 06/11/13 1105   2013/06/11 0930  vancomycin (VANCOCIN) IVPB 1000 mg/200 mL premix  Status:  Discontinued     1,000 mg 200 mL/hr over 60 Minutes Intravenous  Once 2013-06-11 0915 06/11/2013 1003      Assessment: 27 y/o quadriplegic male continues on vancomycin for MRSA PNA. A vancomycin trough drawn this morning was elevated at 34.84mcg/mL. All doses after were held- last vancomycin dose was given this morning at 0200. A random vancomycin level drawn this evening resulted within therapeutic range at 18.56mcg/mL. His SCr has doubled- was 0.2mg /dL on 45/40 and this morning was 0.42mg /dL. Patient specific Ke = 0.0512 based on two levels obtained today. Half-life = ~24hr.  Goal of Therapy:  Vancomycin trough level 15-20 mcg/ml  Plan:  1. Restart vancomycin at 1000mg  IV q12h at 2300--- Based on patient-specific Ke above, estimate this will give a peak level of 22mcg/mL and a trough of 13mcg/mL 2. Follow renal function, LOT, clinical progression, and trough at new SS  Ronald Vinsant D. Fatmata Legere, PharmD, BCPS Clinical Pharmacist Pager: [phone] 06/13/2013 10:44 PM

## 2013-06-13 NOTE — Progress Notes (Signed)
This RN agrees with assessment from 0800 by D. Revis, RN

## 2013-06-13 NOTE — Progress Notes (Signed)
PULMONARY  / CRITICAL CARE MEDICINE  Name: Alan Sanford MRN: [id number] DOB: 1986/02/25    ADMISSION DATE:  06/05/2013 CONSULTATION DATE: 06/03/2013   REFERRING MD : EDP  PRIMARY SERVICE: PCCM   CHIEF COMPLAINT: Acute respiratory failure   BRIEF PATIENT DESCRIPTION: 27 yo C4 quadriplegic. SNF resident admitted with progressive dyspnea. Chest imaging revealed complete R lung collapse. Intubated for bronchoscopy.   SIGNIFICANT EVENTS / STUDIES:  12/04 Admitted with acute respiratory failure, failed BiPAP  12/04 CTA chest: R lung collapse / occlusion of mainstem bronchus  12/04 Bronchoscopy >>> Copious amounts of purulent secretions aspirated from R lung  12/05 Extubated  12/06 Family discussion >>> Aggressive medical management, but DNI/DNR and palliation if worse  12/14 Palliative care consulted >>> Full Code, trach OK  12/15 Respiratory distress, intubated, transferred to ICU 12/16  Ethics committee meeting >>> Extubate when ready, then DNI/DNR, comfort if necessary   LINES / TUBES:  ETT 12/04 >>> 12/05; 12/16 >>> OGT 12/16 >>> R D[place]o midline - chronic  Foley - chronic   CULTURES:  12/04 Blood >> neg 12/04 Urine >>> multiple morphotypes  12/04 Respiratory >> MRSA   ANTIBIOTICS:  Aztreonam 12/04 >> 12/10  Vanc 12/04 >>>  Levoflox 12/10 >>> 12/15  INTERVAL HISTORY:  Intubated for hypoxemic failure.  Underwent therapeutic bronchoscopy. Oxygen requirements down overnight.  VITAL SIGNS: Temp:  [96.3 F (35.7 C)-98.9 F (37.2 C)] 98.6 F (37 C) (12/17 0800) Pulse Rate:  [41-102] 92 (12/17 1000) Resp:  [17-25] 24 (12/17 1000) BP: (87-121)/(41-67) 108/50 mmHg (12/17 1000) SpO2:  [35 %-98 %] 94 % (12/17 1000) FiO2 (%):  [40 %-100 %] 40 % (12/17 0800) HEMODYNAMICS:   VENTILATOR SETTINGS: Vent Mode:  [-] PRVC FiO2 (%):  [40 %-100 %] 40 % Set Rate:  [14 bmp-24 bmp] 24 bmp Vt Set:  [580 mL] 580 mL PEEP:  [10 cmH20] 10 cmH20 Plateau Pressure:  [24 cmH20-28 cmH20] 28  cmH20 INTAKE / OUTPUT: Intake/Output     12/16 0701 - 12/17 0700 12/17 0701 - 12/18 0700   P.O. 120    I.V. (mL/kg) 665.8 (6.8) 67.5 (0.7)   NG/GT 204.7 200   IV Piggyback 500    Total Intake(mL/kg) 1490.5 (15.2) 267.5 (2.7)   Urine (mL/kg/hr) 1350 (0.6) 100 (0.3)   Emesis/NG output 160 (0.1)    Stool     Total Output 1510 100   Net -19.5 +167.5          PHYSICAL EXAMINATION: General:  Mechanically ventilated, synchronous Neuro:  Encephalopathic, nonfocal, cough / gag diminished HEENT:  PERRL, OETT, facial abrasions Cardiovascular:  RRR, no m/r/g Lungs:  Bilateral air entry, scattered rhonchi Abdomen:  Soft, nontender, bowel sounds diminished, viable colostomy Musculoskeletal:  Contractures  Skin:  No rash  LABS: CBC  Recent Labs Lab 06/11/13 0615 06/12/13 0430 06/13/13 0809  WBC 12.1* 10.0 8.8  HGB 11.8* 11.2* 11.4*  HCT 36.2* 34.2* 33.8*  PLT 219 195 220   Coag's No results found for this basename: APTT, INR,  in the last 168 hours BMET  Recent Labs Lab 06/11/13 0615 06/12/13 0430 06/13/13 0809  NA 145 145 146*  K 3.0* 3.2* 2.9*  CL 107 109 109  CO2 28 27 25   BUN 7 9 7   CREATININE 0.30* 0.42* 0.40*  GLUCOSE 92 96 98   Electrolytes  Recent Labs Lab 06/11/13 0615 06/12/13 0430 06/13/13 0405 06/13/13 0809  CALCIUM 8.3* 8.2*  --  8.1*  MG  --   --  1.8  --   PHOS  --   --  2.6  --    Sepsis Markers No results found for this basename: LATICACIDVEN, PROCALCITON, O2SATVEN,  in the last 168 hours ABG  Recent Labs Lab 06/12/13 1429  PHART 7.414  PCO2ART 40.7  PO2ART 155.0*   Liver Enzymes No results found for this basename: AST, ALT, ALKPHOS, BILITOT, ALBUMIN,  in the last 168 hours Cardiac Enzymes No results found for this basename: TROPONINI, PROBNP,  in the last 168 hours Glucose No results found for this basename: GLUCAP,  in the last 168 hours  CXR:  12/17 >>> Much improved aeration of R lung, residual atelectasis / effusion    ASSESSMENT / PLAN:  PULMONARY  A: Acute on chronic resp failure. Pneumonia. Recurrent R lung collapse, better now on positive pressure ventilation.  Mucus plug, recurrent.  P: Goal pH>7.30, SpO2>92 Continuous mechanical support, wean / extubate as tolerate / do not re intubate  VAP bundle Daily SBT Trend ABG/CXR Albuterol Chect PT - Vest   CARDIOVASCULAR  A: Chronic hypotension.  P:  Goal SBP>90  RENAL  A: Chronic Foley. Hypokalemia. Hypernatremia.  P:  Trend BMP  K 40 x 2 Start free water 200 q8h  GASTROINTESTINAL  A: Dysphagia. Protein calorie malnutrition.  P:  NPO  TF per Nutritionist  No PEG  HEMATOLOGIC  A: Chronic anticoagulation for multiple VTE.  P:  Trend CBC  Pradaxa  INFECTIOUS  A: MRSA PNA  P:  Continue Vancomycin x 14 days   ENDOCRINE  A: No active issues.  P:  No intervention required   NEUROLOGIC  A: C4 quadriplegia. Bipolar disorder.  P:  Baclofen, Cogentin, Klonopin, Neurontin, Valproic acid  Abilify, Prozac, Seroquel  D/c Fentanyl gtt Fentanyl PRN  Extubate when able. Then DNI/DNR. Comfort if deteriorates. Mother Luster Landsberg) is medical POA.  I have personally obtained history, examined patient, evaluated and interpreted laboratory and imaging results, reviewed medical records, formulated assessment / plan and placed orders.  CRITICAL CARE:  The patient is critically ill with multiple organ systems failure and requires high complexity decision making for assessment and support, frequent evaluation and titration of therapies, application of advanced monitoring technologies and extensive interpretation of multiple databases. Critical Care Time devoted to patient care services described in this note is 45 minutes.   Lonia Farber, MD Pulmonary and Critical Care Medicine [facility] Pager: [phone]  06/13/2013, 10:48 AM

## 2013-06-13 NOTE — Progress Notes (Signed)
Palliative Care Team at [facility] Progress Note   SUBJECTIVE: Alan Sanford is lethargic on the vent -his sedation has been held for the past few hours. He isn't initiating breaths on PS. He isn't communicating with me. No family at bedside this afternoon-ethics committee meeting held earlier today.  Interval Events: Re-Intubated 12/15-mucous plugging, respiratory failure  OBJECTIVE: Vital Signs: BP 113/49  Pulse 94  Temp(Src) 99.9 F (37.7 C) (Oral)  Resp 24  Ht 5\' 10"  (1.778 m)  Wt 98 kg (216 lb 0.8 oz)  BMI 31.00 kg/m2  SpO2 92%   Intake and Output: 12/16 0701 - 12/17 0700 In: 1490.5 [P.O.:120; I.V.:665.8; NG/GT:204.7; IV Piggyback:500] Out: 1510 [Urine:1350; Emesis/NG output:160]  Physical Exam: General: Vital signs reviewed and noted. Well-developed, well-nourished, in no acute distress; lethargic, not following commands at present.  Head: Normocephalic, atraumatic.  Lungs:  Scattered rhonchi  Heart: RRR. S1 and S2 normal without gallop,  or rubs. (+) murmur  Abdomen:  BS normoactive. Soft, Nondistended, non-tender.  No masses or organomegaly.  Extremities: contractures    Allergies  Allergen Reactions  . Sulfa Antibiotics Anaphylaxis    Unknown   . Cefepime Other (See Comments)    Itching (pt states he can take)  . Xarelto [Rivaroxaban] Other (See Comments)    Acute Mental Status changes    Medications: Scheduled Meds:  . albuterol  2.5 mg Nebulization Q6H  . antiseptic oral rinse  15 mL Mouth Rinse QID  . ARIPiprazole  10 mg Oral Daily  . baclofen  10 mg Oral TID  . benztropine  0.5 mg Oral BID  . chlorhexidine  15 mL Mouth Rinse BID  . clonazePAM  2 mg Oral BID  . dabigatran  150 mg Oral BID  . FLUoxetine  40 mg Oral Daily  . free water  200 mL Per Tube Q8H  . gabapentin  800 mg Oral TID  . oxybutynin  5 mg Oral BID  . pantoprazole (PROTONIX) IV  40 mg Intravenous Q24H  . QUEtiapine  400 mg Oral QHS  . Valproic Acid  750 mg Per Tube BID    Continuous  Infusions: . sodium chloride 20 mL/hr at 06/12/13 2000  . feeding supplement (OSMOLITE 1.2 CAL) 1,000 mL (06/13/13 0953)    PRN Meds: albuterol, fentaNYL  Stool Softner: yes  Palliative Performance Scale: 20%    Labs: CBC    Component Value Date/Time   WBC 8.8 06/13/2013 0809   RBC 3.51* 06/13/2013 0809   HGB 11.4* 06/13/2013 0809   HCT 33.8* 06/13/2013 0809   PLT 220 06/13/2013 0809   MCV 96.3 06/13/2013 0809   MCH 32.5 06/13/2013 0809   MCHC 33.7 06/13/2013 0809   RDW 14.7 06/13/2013 0809   LYMPHSABS 3.1 06/11/2013 0615   MONOABS 1.6* 06/11/2013 0615   EOSABS 0.1 06/11/2013 0615   BASOSABS 0.0 06/11/2013 0615    CMET     Component Value Date/Time   NA 146* 06/13/2013 0809   K 2.9* 06/13/2013 0809   CL 109 06/13/2013 0809   CO2 25 06/13/2013 0809   GLUCOSE 98 06/13/2013 0809   BUN 7 06/13/2013 0809   CREATININE 0.40* 06/13/2013 0809   CALCIUM 8.1* 06/13/2013 0809   PROT 6.8 06/01/2013 0500   ALBUMIN 2.5* 06/01/2013 0500   AST 29 06/01/2013 0500   ALT 42 06/01/2013 0500   ALKPHOS 66 06/01/2013 0500   BILITOT 0.5 06/01/2013 0500   GFRNONAA >90 06/13/2013 0809   GFRAA >90 06/13/2013 0809  ASSESSMENT/ PLAN: Alan Sanford has been a quadriplegic since 2011 following a C-Spine self inflicted injury/suicide attempt. He has been in and out of nursing homes and [facility] and was admitted with respiratory failure and mucous plugging. There was difficulty in determining his goals of care and advance directives related to his capacity for his own decision making and chain of formal HCPOA decision makers with his family. He was re-intubated 12/15 after resending his code status and he was in the process of considering options when he emergently required intervention. He has a HCPOA with three persons named to act alone in a specific order, not collectively. Alan Sanford is his primary Management consultant. His fiancee Alan Sanford is not named as #1 HCPOA but has been Chief of Staff for the last few  years, and his aunt is #3 on [employer] and wants him to receive aggressive medical interventions and be a FULL CODE. The HCPOA specifically states named persons should act alone in the order listed. Ethics consult was obtained to assist with making an ethical decision regarding honoring patients HCPOA and in determining best course of action for Alan Sanford. Full ethics note to follow from [facility].  Summary of Goals/Recommendations:  Medically Maximize his current level of care-extubate him without re-intubation when he has reached best possible improvement-so that he has every best chance at short term recovery.   DNR  Comfort and QOL primary goals.  If he regains cognitive and communication ability will need full capacity determination and ongoing discussion about his wishes.  No trach  Our team will continue to follow and support patient and family- will await CCM determination re: appropriate time to extubate.  [name], DO Palliative Medicine   35 minutes, Ethic consultation time 2 hours in addition to standard care. Greater than 50%  of this time was spent counseling and coordinating care related to the above assessment and plan.   Alan Petrin, DO  06/13/2013, 4:45 PM  Please contact Palliative Medicine Team phone at [phone] for questions and concerns.

## 2013-06-13 NOTE — Progress Notes (Signed)
ANTIBIOTIC CONSULT NOTE - FOLLOW UP  Pharmacy Consult:  Vancomycin  Indication:  MRSA PNA  Allergies  Allergen Reactions  . Sulfa Antibiotics Anaphylaxis    Unknown   . Cefepime Other (See Comments)    Itching (pt states he can take)  . Xarelto [Rivaroxaban] Other (See Comments)    Acute Mental Status changes    Patient Measurements: Height: 5\' 10"  (177.8 cm) Weight: 216 lb 0.8 oz (98 kg) IBW/kg (Calculated) : 73  Vital Signs: Temp: 98.6 F (37 C) (12/17 0800) Temp src: Oral (12/17 0800) BP: 108/50 mmHg (12/17 1000) Pulse Rate: 92 (12/17 1000) Intake/Output from previous day: 12/16 0701 - 12/17 0700 In: 1490.5 [P.O.:120; I.V.:665.8; NG/GT:204.7; IV Piggyback:500] Out: 1510 [Urine:1350; Emesis/NG output:160] Intake/Output from this shift: Total I/O In: 267.5 [I.V.:67.5; NG/GT:200] Out: 100 [Urine:100]  Labs:  Recent Labs  06/11/13 0615 06/12/13 0430 06/13/13 0809  WBC 12.1* 10.0 8.8  HGB 11.8* 11.2* 11.4*  PLT 219 195 220  CREATININE 0.30* 0.42* 0.40*   Estimated Creatinine Clearance: 165.7 ml/min (by C-G formula based on Cr of 0.4).  Recent Labs  06/13/13 0837  VANCOTROUGH 34.4*     Assessment: 27 YO quadriplegic male to continue on vancomycin for MRSA PNA (plans for 14 days treatment per MD). A vancomycin trough today was 34.4 at ~ 0830. Last vancomycin dose was given at 0200 today. Noted that last vancomycin trough was 17.5 on 12/11. Patient noted with recurrent R lung collapse with intubation on 12/15 and noted for Ethics team meeting today.    Zyvox 12/4 >>12/7 Azactam 12/4>>12/10 Vanc 12/7 >> -12/9: VT 12.7. Dose increased. -12/11: VT 17.5 on 1250mg  IV q8hr 12/17: vanc level = 34.4 on 0830 Levaquin 12/10>>12/15  12/4 urine cx- mult types 12/4 blood cx x 2- NEG 12/4 resp- MRSA  Goal of Therapy:  Vancomycin trough level 15-20 mcg/ml   Plan:  -Hold vancomycin for now -Will plan for a vancomycin random tonight  Harland German, Pharm D  06/13/2013 10:51 AM

## 2013-06-14 ENCOUNTER — Inpatient Hospital Stay (HOSPITAL_COMMUNITY): Payer: Federal, State, Local not specified - PPO

## 2013-06-14 LAB — CULTURE, RESPIRATORY: Culture: NO GROWTH

## 2013-06-14 LAB — MAGNESIUM: Magnesium: 1.9 mg/dL (ref 1.5–2.5)

## 2013-06-14 LAB — BASIC METABOLIC PANEL
BUN: 8 mg/dL (ref 6–23)
CO2: 25 mEq/L (ref 19–32)
Calcium: 8 mg/dL — ABNORMAL LOW (ref 8.4–10.5)
Chloride: 108 mEq/L (ref 96–112)
GFR calc Af Amer: >90 mL/min (ref >90)
Glucose, Bld: 107 mg/dL — ABNORMAL HIGH (ref 70–99)
Potassium: 3.2 mEq/L — ABNORMAL LOW (ref 3.5–5.1)

## 2013-06-14 LAB — CBC
HCT: 32.1 % — ABNORMAL LOW (ref 39.0–52.0)
Hemoglobin: 10.7 g/dL — ABNORMAL LOW (ref 13.0–17.0)
MCH: 32.2 pg (ref 26.0–34.0)
RBC: 3.32 MIL/uL — ABNORMAL LOW (ref 4.22–5.81)
WBC: 8.6 10*3/uL (ref 4.0–10.5)

## 2013-06-14 LAB — CULTURE, RESPIRATORY W GRAM STAIN

## 2013-06-14 MED ORDER — SCOPOLAMINE 1 MG/3DAYS TD PT72
1.0000 | MEDICATED_PATCH | TRANSDERMAL | Status: DC
  Administered 2013-06-14: 1.5 mg via TRANSDERMAL
  Filled 2013-06-14: qty 1

## 2013-06-14 MED ORDER — SODIUM CHLORIDE 0.9 % IV SOLN
25.0000 ug/h | INTRAVENOUS | Status: DC
  Administered 2013-06-14: 75 ug/h via INTRAVENOUS
  Administered 2013-06-14: 25 ug/h via INTRAVENOUS
  Administered 2013-06-14: 50 ug/h via INTRAVENOUS
  Administered 2013-06-15: 100 ug/h via INTRAVENOUS
  Filled 2013-06-14 (×2): qty 50

## 2013-06-14 MED ORDER — ATROPINE SULFATE 1 % OP SOLN
2.0000 [drp] | OPHTHALMIC | Status: DC | PRN
  Filled 2013-06-14: qty 2

## 2013-06-14 MED ORDER — POTASSIUM CHLORIDE 20 MEQ/15ML (10%) PO LIQD
30.0000 meq | ORAL | Status: AC
  Administered 2013-06-14 (×2): 30 meq
  Filled 2013-06-14 (×2): qty 30

## 2013-06-14 MED ORDER — LORAZEPAM 2 MG/ML IJ SOLN
1.0000 mg | Freq: Three times a day (TID) | INTRAMUSCULAR | Status: DC
  Administered 2013-06-14 – 2013-06-15 (×2): 1 mg via INTRAVENOUS
  Filled 2013-06-14 (×3): qty 1

## 2013-06-14 MED ORDER — FENTANYL BOLUS VIA INFUSION
50.0000 ug | INTRAVENOUS | Status: DC | PRN
  Filled 2013-06-14: qty 50

## 2013-06-14 MED ORDER — VALPROATE SODIUM 500 MG/5ML IV SOLN
500.0000 mg | Freq: Three times a day (TID) | INTRAVENOUS | Status: DC
  Administered 2013-06-14 – 2013-06-15 (×3): 500 mg via INTRAVENOUS
  Filled 2013-06-14 (×6): qty 5

## 2013-06-14 MED ORDER — MORPHINE SULFATE 4 MG/ML IJ SOLN
INTRAMUSCULAR | Status: AC
  Administered 2013-06-14: 13:00:00
  Filled 2013-06-14: qty 1

## 2013-06-14 MED ORDER — MORPHINE SULFATE 2 MG/ML IJ SOLN
2.0000 mg | INTRAMUSCULAR | Status: DC | PRN
  Administered 2013-06-14: 4 mg via INTRAVENOUS

## 2013-06-14 NOTE — Progress Notes (Signed)
PULMONARY  / CRITICAL CARE MEDICINE  Name: Alan Sanford MRN: [id number] DOB: [birth year]    ADMISSION DATE:  06/01/2013 CONSULTATION DATE: 05/30/2013   REFERRING MD : EDP  PRIMARY SERVICE: PCCM   CHIEF COMPLAINT: Acute respiratory failure   BRIEF PATIENT DESCRIPTION: 27 yo C4 quadriplegic. SNF resident admitted with progressive dyspnea. Chest imaging revealed complete R lung collapse. Intubated for bronchoscopy.   SIGNIFICANT EVENTS / STUDIES:  12/04 Admitted with acute respiratory failure, failed BiPAP  12/04 CTA chest: R lung collapse / occlusion of mainstem bronchus  12/04 Bronchoscopy >>> Copious amounts of purulent secretions aspirated from R lung  12/05 Extubated  12/06 Family discussion >>> Aggressive medical management, but DNI/DNR and palliation if worse  12/14 Palliative care consulted >>> Full Code, trach OK  12/15 Respiratory distress, intubated, transferred to ICU 12/16  Ethics committee meeting >>> Extubate when ready, then DNI/DNR, comfort if necessary   LINES / TUBES:  ETT 12/04 >>> 12/05; 12/16 >>> OGT 12/16 >>> R [place] midline - chronic  Foley - chronic   CULTURES:  12/04 Blood >> neg 12/04 Urine >>> multiple morphotypes  12/04 Respiratory >> MRSA   ANTIBIOTICS:  Aztreonam 12/04 >> 12/10  Vanc 12/04 >>>  Levoflox 12/10 >>> 12/15  INTERVAL HISTORY: Wants ETT out, understands that he might not survive extubation. Discussed with both medical POAs present - they request extubation. Would peruse comfort measures if in distress.  VITAL SIGNS: Temp:  [98.9 F (37.2 C)-99.9 F (37.7 C)] 98.9 F (37.2 C) (12/18 0400) Pulse Rate:  [87-103] 100 (12/18 0900) Resp:  [18-24] 21 (12/18 0900) BP: (85-119)/(41-68) 104/44 mmHg (12/18 0900) SpO2:  [88 %-100 %] 94 % (12/18 0900) FiO2 (%):  [40 %-60 %] 40 % (12/18 1100)  HEMODYNAMICS:   VENTILATOR SETTINGS: Vent Mode:  [-] CPAP;PSV FiO2 (%):  [40 %-60 %] 40 % Set Rate:  [24 bmp] 24 bmp Vt Set:  [580 mL] 580  mL PEEP:  [10 cmH20] 10 cmH20 Pressure Support:  [5 cmH20] 5 cmH20 Plateau Pressure:  [24 cmH20-32 cmH20] 28 cmH20  INTAKE / OUTPUT: Intake/Output     12/17 0701 - 12/18 0700 12/18 0701 - 12/19 0700   P.O.     I.V. (mL/kg) 467.5 (4.8)    NG/GT 1620    IV Piggyback     Total Intake(mL/kg) 2087.5 (21.3)    Urine (mL/kg/hr) 1400 (0.6) 125 (0.3)   Emesis/NG output     Stool 200 (0.1)    Total Output 1600 125   Net +487.5 -125          PHYSICAL EXAMINATION: General:  Mechanically ventilated, synchronous Neuro:  Follows commands, answers questions non verbally HEENT:  PERRL, OETT Cardiovascular:  RRR, no m/r/g Lungs:  Bilateral air entry, scattered rhonchi Abdomen:  Soft, nontender, bowel sounds diminished, viable colostomy Musculoskeletal:  Contractures  Skin:  No rash  LABS: CBC  Recent Labs Lab 06/12/13 0430 06/13/13 0809 06/14/13 0405  WBC 10.0 8.8 8.6  HGB 11.2* 11.4* 10.7*  HCT 34.2* 33.8* 32.1*  PLT 195 220 231   Coag's No results found for this basename: APTT, INR,  in the last 168 hours BMET  Recent Labs Lab 06/12/13 0430 06/13/13 0809 06/14/13 0405  NA 145 146* 142  K 3.2* 2.9* 3.2*  CL 109 109 108  CO2 27 25 25   BUN 9 7 8   CREATININE 0.42* 0.40* 0.41*  GLUCOSE 96 98 107*   Electrolytes  Recent Labs Lab 06/12/13 0430 06/13/13 0405  06/13/13 0809 06/14/13 0405  CALCIUM 8.2*  --  8.1* 8.0*  MG  --  1.8  --  1.9  PHOS  --  2.6  --  2.8   Sepsis Markers No results found for this basename: LATICACIDVEN, PROCALCITON, O2SATVEN,  in the last 168 hours  ABG  Recent Labs Lab 06/12/13 1429  PHART 7.414  PCO2ART 40.7  PO2ART 155.0*   Liver Enzymes No results found for this basename: AST, ALT, ALKPHOS, BILITOT, ALBUMIN,  in the last 168 hours Cardiac Enzymes No results found for this basename: TROPONINI, PROBNP,  in the last 168 hours Glucose No results found for this basename: GLUCAP,  in the last 168 hours  CXR:  12/18 >>> Persistent  R lung base consolidation / effusion, the rest of the lung appears open  ASSESSMENT / PLAN:  PULMONARY  A: Acute on chronic resp failure. Pneumonia. Recurrent R lung collapse, better now on positive pressure ventilation.  Mucus plug, recurrent.  P: Goal pH>7.30, SpO2>92 Extubate Comfort, if distress Trend ABG/CXR Albuterol Chect PT - Vest   CARDIOVASCULAR  A: Chronic hypotension.  P:  Goal SBP>90 No vasopressors / defibrillation / CPR  RENAL  A: Chronic Foley. Hypokalemia. P:  Trend BMP  K 30 x 2  GASTROINTESTINAL  A: Dysphagia. Protein calorie malnutrition.  P:  NPO  D/c TF for now No PEG  HEMATOLOGIC  A: Chronic anticoagulation for multiple VTE.  P:  Trend CBC  Pradaxa  INFECTIOUS  A: MRSA PNA  P:  Continue Vancomycin x 14 days   ENDOCRINE  A: No active issues.  P:  No intervention required   NEUROLOGIC  A: C4 quadriplegia. Bipolar disorder.  P:  Baclofen, Cogentin, Klonopin, Neurontin, Valproic acid  Abilify, Prozac, Seroquel   I have personally obtained history, examined patient, evaluated and interpreted laboratory and imaging results, reviewed medical records, formulated assessment / plan and placed orders.  CRITICAL CARE:  The patient is critically ill with multiple organ systems failure and requires high complexity decision making for assessment and support, frequent evaluation and titration of therapies, application of advanced monitoring technologies and extensive interpretation of multiple databases. Critical Care Time devoted to patient care services described in this note is 35 minutes.   Lonia Farber, MD Pulmonary and Critical Care Medicine [facility] Pager: [phone]  06/14/2013, 11:23 AM

## 2013-06-14 NOTE — Progress Notes (Signed)
Per DR Z place pt on SBT trial cpap/ps 5/10,40

## 2013-06-14 NOTE — Progress Notes (Signed)
Per RNCM, pt on vent and family has decided not to trach pt. Pt will wean from vent and will be comfort care if unable to survive without ventilator support. CSW continues to follow case.     Maryclare Labrador, MSW, [facility] Clinical Social Worker [phone]

## 2013-06-14 NOTE — Procedures (Signed)
Extubation Procedure Note  Patient Details:   Name: Alan Sanford DOB: April 15, 1986 MRN: [id number]   Airway Documentation:  Airway 8 mm (Active)  Secured at (cm) 24 cm 06/14/2013  7:50 AM  Measured From Lips 06/14/2013  7:50 AM  Secured Location Left 06/14/2013  7:50 AM  Secured By [employer] 06/14/2013  7:50 AM  Tube Holder Repositioned Yes 06/14/2013  7:50 AM  Cuff Pressure (cm H2O) 24 cm H2O 06/14/2013  7:50 AM  Site Condition Dry 06/14/2013  7:50 AM    Evaluation  O2 sats: 84 Complications: extubation with comfort measures  Patient extubation with comfort measures Bilateral Breath Sounds: Rhonchi Suctioning: Oral;Airway Per Dr Herma Carson extubate pt to NRB with comfort measures. Sats 84 on NRB, HR 114, RR 29. NTS pt mod thick yell white. Pt seems comfortable.  [name] 06/14/2013, 11:46 AM

## 2013-06-14 NOTE — Progress Notes (Signed)
[facility] ADULT ICU REPLACEMENT PROTOCOL FOR AM LAB REPLACEMENT ONLY  The patient does apply for the [facility] Adult ICU Electrolyte Replacment Protocol based on the criteria listed below:   1. Is GFR >/= 40 ml/min? yes  Patient's GFR today is 90 2. Is urine output >/= 0.5 ml/kg/hr for the last 6 hours? yes Patient's UOP is 0.50 ml/kg/hr 3. Is BUN < 60 mg/dL? yes  Patient's BUN today is 8 4. Abnormal electrolyte(s): K+ 3.2 5. Ordered repletion with: see orders 6. If a panic level lab has been reported, has the CCM MD in charge been notified? yes.   Physician:  Dr. Orbie Hurst, Leeandre Nordling A 06/14/2013 6:13 AM

## 2013-06-14 NOTE — Progress Notes (Signed)
CPT hel-not indicated . Pt to be extubated to comfort care. RN aware

## 2013-06-14 NOTE — Progress Notes (Signed)
Patient's girlfriend who is alo his POA refuses all therapy except comfort care. Patient is on a fentanyl drip and seems not to be in distress.Will continue with oral care , keep patient turned, cleaned and keep him comfortable per the wish of the family.

## 2013-06-14 NOTE — Progress Notes (Signed)
Pt. sats dropped into the 70's-80's about 4 times. Pt. Was bagged up each time and suctioned. Pt. sats finally regained and RT increased pt. Oxygen. Pt. Is tolerating for now. RT to monitor.

## 2013-06-14 NOTE — Progress Notes (Signed)
Patient extubated about 1 hours ago. He specifically endorsed extubation and also no re-intubation. Alan Sanford his ex-fiancee is at his bedside. He has increasing dyspnea, diaphoretic, and tachycardic.  1. Started Fentanyl Infusion 2. He is not alert enough to take PO meds-I have discontinued these-started scheduled ativan and depakote to avoid withdrawal associated with high levels of psych meds prior to admission.  Will follow closely for comfort-titration and boluses available.  [name], DO Palliative Medicine

## 2013-06-14 NOTE — Progress Notes (Signed)
Nutrition Brief Note  Chart reviewed. Pt now transitioning to comfort care.  No further nutrition interventions warranted at this time.  Please re-consult as needed.   Myonna Chisom, MS RD LDN Clinical Inpatient Dietitian Pager: [phone] Weekend/After hours pager: [phone]    

## 2013-06-14 NOTE — Ethics Note (Signed)
Ethics Consult Note:  An Ethics Consult was requested by Dr. Ladona Ridgel, Palliative Care, to assist with disagreement between designated HCPOA over patient's wishes. All three HCPOA were able to be present for discussion, two in person and one via conference call. Those present included the first designated HCPOA pts. Step mother, second - his fiance, and third his Aunt. Also present in addition to Dr. Derenda Mis were Dr. Dorothy Spark, the Palliative Care PA, Dr.Zubelevitzkiy the Critical Physician managing his care, and Drs. Charlton Haws and Selina Cooley members of the [employer].  At present the patient is determined to not have capacity for decision making. However according to Dr. Ladona Ridgel and Dr. Herma Carson and the two [facility] present at admission when he was considered to have some capacity for decision making he was initially not wanting aggressive care and was a DNR. However there was a point early in his admission when he seemed to indicate some ambivalence this decision and this is what lead to this consultation now that he cannot speak for himself.   A lengthy discussion occurred around the patient's current medical condition and his declining respiratory capacity and multiple organ failure. He is currently on a respiratory and while able to be weaned it was the physicians clear assessment that he will continue to quickly decompensate again. Focusing on the patient's previous expressions of what brought meaning to his life and his clear statements that he was tired and did not want to live on a respirator nor to be in constant pain the physicians indicated that his ability to return to his former state would not be possible and that he would be facing frequently recurrances of his current situation.  In the end it was determined that there would not be agreement among the [facility] however the [place] statues state that the first HCPOA of attorney listed will have sole authority. She felt that he would not want to live in a  declining state and the consideration of having a limited code was discussed and further decisions will be made by this HCPOA and the primary physicians.    Kathlyn [place] Ethics Consult Service [phone]

## 2013-06-14 NOTE — Progress Notes (Signed)
ETT secure. Small breakdown noted on lips. Moved ETT to pts left. Deep oral sxn done

## 2013-06-14 NOTE — Progress Notes (Signed)
CPT not indicated. Comfort care. RN aware

## 2013-06-15 MED ORDER — VANCOMYCIN HCL IN DEXTROSE 1-5 GM/200ML-% IV SOLN
1000.0000 mg | Freq: Two times a day (BID) | INTRAVENOUS | Status: DC
  Filled 2013-06-15 (×2): qty 200

## 2013-06-21 NOTE — Discharge Summary (Signed)
Name: Alan Sanford MRN: [id number] DOB: 1985-07-05  PCCM DEATH NOTE  Time of death:  2013-06-26  13:44  Cause of death: Acute respiratory failure  Discharge diagnoses: Acute on chronic respiratory failure MRSA pneumonia Recurrent R lung collapse secondary to mucus plugging Chronic hypotension Hypokalemia Dysphagia Protein calorie malnutrition C4 quadriplegia Bipolar mood disorder   Brief hospital course: This is 27 yo C4 quadriplegic SNF resident who admitted with progressive dyspnea. Chest imaging revealed complete R lung collapse. Respiratory cultures revealed MRSA. In spite of completing antibiotic course the patient continued to have ineffective cough / airway clearance.  His course was complicated by several episodes of intubation and need for therapeutic bronchoscopy.  Patient's condition was deemed requiring tracheostomy and possibly intermittent mechanical ventilation long term.  This was discussed with the patient and his medical POAs. The consensus was reached that following patient's wishes no tracheostomy should be performed and comfort measures pursued .  Life support was withdrawn and comfort measures provided.  Patient expired shortly after.  No resuscitation was attempted.  Lonia Farber, MD Pulmonary and Critical Care Medicine [facility] Pager: [phone]

## 2013-06-28 NOTE — Progress Notes (Signed)
Per RNCM, pt is now on fentanyl drip and on comfort care. CSW continues to follow case.   Maryclare Labrador, MSW, [facility] Clinical Social Worker [phone]

## 2013-06-28 NOTE — Progress Notes (Signed)
PULMONARY  / CRITICAL CARE MEDICINE  Name: Alan Sanford MRN: [id number] DOB: [birth year]    ADMISSION DATE:  06/19/2013 CONSULTATION DATE: 06/18/2013   REFERRING MD : EDP  PRIMARY SERVICE: PCCM   CHIEF COMPLAINT: Acute respiratory failure   BRIEF PATIENT DESCRIPTION: 28 yo C4 quadriplegic. SNF resident admitted with progressive dyspnea. Chest imaging revealed complete R lung collapse. Intubated for bronchoscopy.   SIGNIFICANT EVENTS / STUDIES:  12/04 Admitted with acute respiratory failure, failed BiPAP  12/04 CTA chest: R lung collapse / occlusion of mainstem bronchus  12/04 Bronchoscopy >>> Copious amounts of purulent secretions aspirated from R lung  12/05 Extubated  12/06 Family discussion >>> Aggressive medical management, but DNI/DNR and palliation if worse  12/14 Palliative care consulted >>> Full Code, trach OK  12/15 Respiratory distress, intubated, transferred to ICU 12/16  Ethics committee meeting >>> Extubate when ready, then DNI/DNR, comfort if necessary  12/18  Terminally extubated, Fentanyl gtt  LINES / TUBES:  ETT 12/04 >>> 12/05; 12/16 >>> 12/18 OGT 12/16 >>> 12/18 R [place] midline - chronic  Foley - chronic   CULTURES:  12/04 Blood >> neg 12/04 Urine >>> multiple morphotypes  12/04 Respiratory >> MRSA   ANTIBIOTICS:  Aztreonam 12/04 >> 12/10  Vanc 12/04 >>> 12/18 Levoflox 12/10 >>> 12/15  INTERVAL HISTORY:  Terminally extubated, Morphine gtt started.  VITAL SIGNS: Temp:  [95.2 F (35.1 C)] 95.2 F (35.1 C) (12/19 0820) Pulse Rate:  [73-111] 89 (12/19 0820) Resp:  [5-31] 14 (12/19 0820) BP: (89-92)/(46-52) 89/52 mmHg (12/19 0820) SpO2:  [83 %-100 %] 94 % (12/19 0820) FiO2 (%):  [15 %-100 %] 15 % (12/19 0820)  HEMODYNAMICS:   VENTILATOR SETTINGS: Vent Mode:  [-]  FiO2 (%):  [15 %-100 %] 15 %  INTAKE / OUTPUT: Intake/Output     12/18 0701 - 12/19 0700 12/19 0701 - 12/20 0700   I.V. (mL/kg) 98.6 (1) 40 (0.4)   NG/GT 80    IV Piggyback 165    Total Intake(mL/kg) 343.6 (3.5) 40 (0.4)   Urine (mL/kg/hr) 1185 (0.5) 250 (0.6)   Stool  150 (0.4)   Total Output 1185 400   Net -841.5 -360          PHYSICAL EXAMINATION: General:  No distress Neuro:  Obtunded HEENT:  Sluggish pupils Cardiovascular:  RRR, no m/r/g Lungs:  Bilateral diminished air entry Abdomen:  Soft, nontender, bowel sounds diminished, viable colostomy Musculoskeletal:  Contractures  Skin:  No rash  LABS: CBC  Recent Labs Lab 06/12/13 0430 06/13/13 0809 06/14/13 0405  WBC 10.0 8.8 8.6  HGB 11.2* 11.4* 10.7*  HCT 34.2* 33.8* 32.1*  PLT 195 220 231   Coag's No results found for this basename: APTT, INR,  in the last 168 hours BMET  Recent Labs Lab 06/12/13 0430 06/13/13 0809 06/14/13 0405  NA 145 146* 142  K 3.2* 2.9* 3.2*  CL 109 109 108  CO2 27 25 25   BUN 9 7 8   CREATININE 0.42* 0.40* 0.41*  GLUCOSE 96 98 107*   Electrolytes  Recent Labs Lab 06/12/13 0430 06/13/13 0405 06/13/13 0809 06/14/13 0405  CALCIUM 8.2*  --  8.1* 8.0*  MG  --  1.8  --  1.9  PHOS  --  2.6  --  2.8   Sepsis Markers No results found for this basename: LATICACIDVEN, PROCALCITON, O2SATVEN,  in the last 168 hours  ABG  Recent Labs Lab 06/12/13 1429  PHART 7.414  PCO2ART 40.7  PO2ART 155.0*  Liver Enzymes No results found for this basename: AST, ALT, ALKPHOS, BILITOT, ALBUMIN,  in the last 168 hours Cardiac Enzymes No results found for this basename: TROPONINI, PROBNP,  in the last 168 hours Glucose No results found for this basename: GLUCAP,  in the last 168 hours  CXR:  12/18 >>> Persistent R lung base consolidation / effusion, the rest of the lung appears open  ASSESSMENT / PLAN:  PULMONARY  A: Acute on chronic resp failure. Pneumonia. Recurrent R lung collapse, better now on positive pressure ventilation.  Mucus plug, recurrent.  P: Supplemental oxygen for comfort NO intubation / BiPAP  CARDIOVASCULAR  A: Chronic hypotension.  P:  NO  vasopressors / defibrillation / CPR  RENAL  A: Chronic Foley. Hypokalemia. P:  NO labs  GASTROINTESTINAL  A: Dysphagia. Protein calorie malnutrition.  P:  No PEG  HEMATOLOGIC  A: Chronic anticoagulation for multiple VTE.  P:  Trend CBC  D/c Pradaxa as NPO  INFECTIOUS  A: MRSA PNA  P:  Vancomycin completed  ENDOCRINE  A: No active issues.  P:  No intervention required   NEUROLOGIC  A: C4 quadriplegia. Bipolar disorder.  P:  D/c Baclofen, Cogentin, Klonopin, Neurontin, Valproic acid, Abilify, Prozac, Seroquel as NPO Fentanyl gtt  Transfer to medical floor.  Comfort measures only. Keep on PCCM service. Palliative Care will follow, possible referral for hospice.  I have personally obtained history, examined patient, evaluated and interpreted laboratory and imaging results, reviewed medical records, formulated assessment / plan and placed orders.  Lonia Farber, MD Pulmonary and Critical Care Medicine [facility] Pager: [phone]  06/09/2013, 11:11 AM

## 2013-06-28 NOTE — Significant Event (Signed)
Notes indicate that pt is full comfort care. I have DC'd AM labs and vancomycin adn we have removed from eLink monitoring   Billy Fischer, MD ; [facility] [phone].  After 5:30 PM or weekends, call [phone]

## 2013-06-28 DEATH — deceased

## 2015-04-12 IMAGING — XA IR FLUORO GUIDE CV LINE*R*
1 series · 1 of 1 positions shown · non-contrast
Comparison: none

CLINICAL DATA: Right arm PICC placed 2 weeks ago, will not
aspirate.

PICC   EXCHANGE UNDER FLUOROSCOPY
TECHNIQUE: After written informed consent was obtained, patient was
placed in the supine position on angiographic table.  Right arm
PICC and surrounding skin prepped using maximum barrier technique
including cap and mask, sterile gown, sterile gloves, large sterile
sheet, and Chlorhexidine   as cutaneous antisepsis.  The region was
infiltrated locally with 1% lidocaine. The previously placed PICC
was partially withdrawn, cut, and exchanged over a 018 guidewire
for a peel-away sheath, through which a 5-French double-lumen power
injectable PICC trimmed to 41cm was advanced, positioned with its
tip near the cavoatrial junction.  Spot chest radiograph confirms
appropriate catheter position.  Catheter was flushed per protocol
and secured externally with 0-Prolene sutures.  The patient
tolerated procedure well, with no immediate complication.
Fluoroscopy time: 30 seconds

[Series 1: fl cto · 1 of 1 slices shown]
[im 1/1]
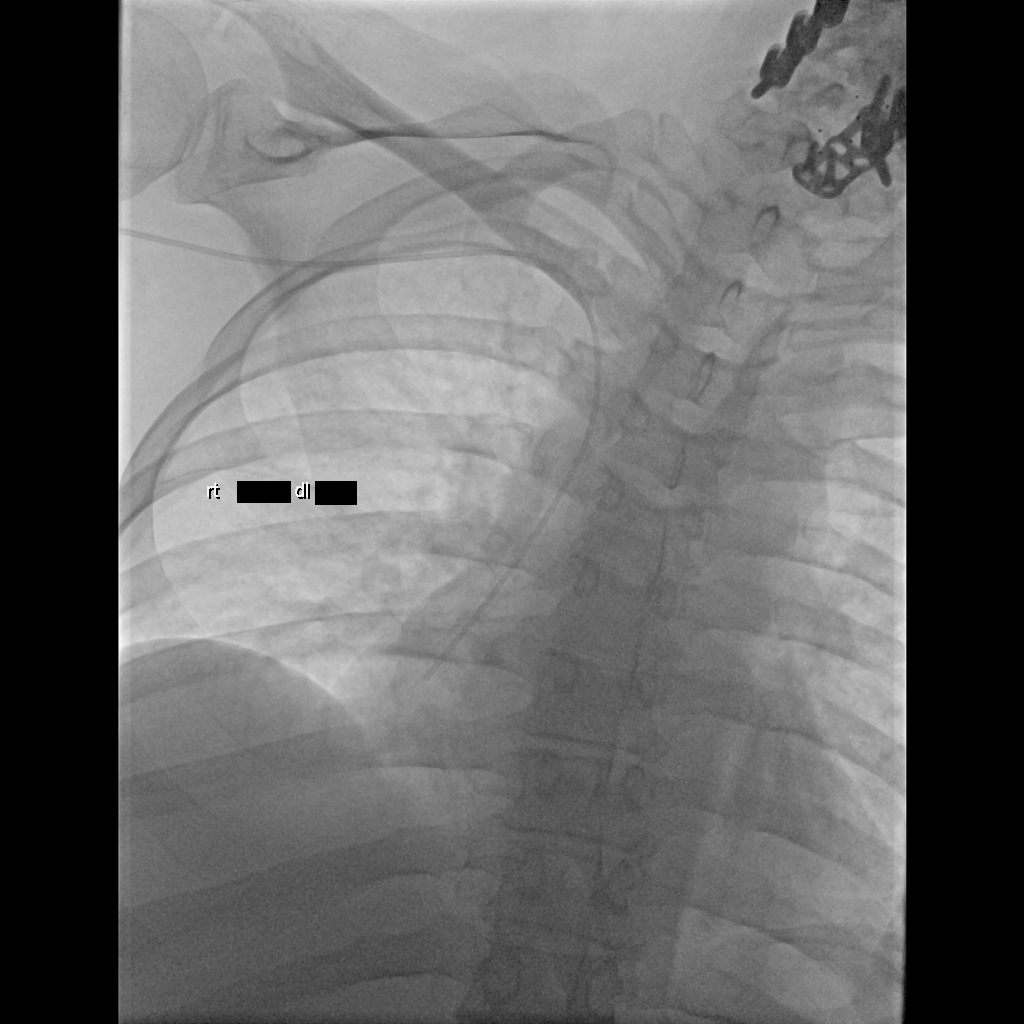

[1 of 1 positions shown; findings below may reference images not displayed]

IMPRESSION: Technically successful five French double lumen power injectable
PICC exchange.
